# Patient Record
Sex: Female | Born: 1955 | Race: Asian | Hispanic: No | Marital: Married | Smoking: Never smoker
Health system: Southern US, Community
[De-identification: ages and names within clinical notes are randomized; demographics above are authoritative.]

## PROBLEM LIST (undated history)

## (undated) DIAGNOSIS — K859 Acute pancreatitis without necrosis or infection, unspecified: Secondary | ICD-10-CM

## (undated) DIAGNOSIS — M810 Age-related osteoporosis without current pathological fracture: Secondary | ICD-10-CM

## (undated) DIAGNOSIS — E042 Nontoxic multinodular goiter: Secondary | ICD-10-CM

## (undated) DIAGNOSIS — U071 COVID-19: Secondary | ICD-10-CM

## (undated) DIAGNOSIS — I10 Essential (primary) hypertension: Secondary | ICD-10-CM

## (undated) DIAGNOSIS — Q670 Congenital facial asymmetry: Secondary | ICD-10-CM

## (undated) DIAGNOSIS — M7500 Adhesive capsulitis of unspecified shoulder: Secondary | ICD-10-CM

## (undated) DIAGNOSIS — D329 Benign neoplasm of meninges, unspecified: Secondary | ICD-10-CM

## (undated) DIAGNOSIS — C801 Malignant (primary) neoplasm, unspecified: Secondary | ICD-10-CM

## (undated) DIAGNOSIS — C50919 Malignant neoplasm of unspecified site of unspecified female breast: Secondary | ICD-10-CM

## (undated) HISTORY — DX: Age-related osteoporosis without current pathological fracture: M81.0

## (undated) HISTORY — PX: COSMETIC SURGERY: SHX468

## (undated) HISTORY — DX: Adhesive capsulitis of unspecified shoulder: M75.00

## (undated) HISTORY — DX: Essential (primary) hypertension: I10

## (undated) HISTORY — PX: CHOLECYSTECTOMY, LAPAROSCOPIC: SHX56

## (undated) HISTORY — DX: Malignant (primary) neoplasm, unspecified: C80.1

## (undated) HISTORY — PX: BREAST SURGERY: SHX581

## (undated) HISTORY — DX: Malignant neoplasm of unspecified site of unspecified female breast: C50.919

## (undated) HISTORY — DX: Benign neoplasm of meninges, unspecified: D32.9

## (undated) HISTORY — DX: Acute pancreatitis without necrosis or infection, unspecified: K85.90

## (undated) HISTORY — PX: PLACEMENT OF BREAST IMPLANTS: SHX6334

## (undated) HISTORY — DX: COVID-19: U07.1

## (undated) HISTORY — DX: Nontoxic multinodular goiter: E04.2

## (undated) HISTORY — PX: OOPHORECTOMY: SHX86

## (undated) HISTORY — PX: CHOLECYSTECTOMY: SHX55

## (undated) HISTORY — PX: BIOPSY THYROID: PRO38

## (undated) HISTORY — DX: Congenital facial asymmetry: Q67.0

## (undated) HISTORY — PX: BRAIN SURGERY: SHX531

---

## 2009-07-10 DIAGNOSIS — Z853 Personal history of malignant neoplasm of breast: Secondary | ICD-10-CM | POA: Insufficient documentation

## 2009-07-10 DIAGNOSIS — C50919 Malignant neoplasm of unspecified site of unspecified female breast: Secondary | ICD-10-CM

## 2009-07-10 HISTORY — DX: Malignant neoplasm of unspecified site of unspecified female breast: C50.919

## 2009-10-22 DIAGNOSIS — C50919 Malignant neoplasm of unspecified site of unspecified female breast: Secondary | ICD-10-CM | POA: Insufficient documentation

## 2014-10-07 DIAGNOSIS — R229 Localized swelling, mass and lump, unspecified: Secondary | ICD-10-CM | POA: Insufficient documentation

## 2016-11-21 ENCOUNTER — Ambulatory Visit
Admission: RE | Admit: 2016-11-21 | Discharge: 2016-11-21 | Disposition: A | Discharge status: Home / Self Care | Payer: BLUE CROSS/BLUE SHIELD | Source: Ambulatory Visit | Attending: Medical | Admitting: Medical

## 2016-11-21 ENCOUNTER — Ambulatory Visit: Payer: Self-pay | Admitting: Medical

## 2016-11-21 ENCOUNTER — Encounter: Payer: Self-pay | Admitting: Medical

## 2016-11-21 VITALS — BP 140/85 | HR 71 | Temp 99.4°F | Resp 16 | Ht 62.0 in | Wt 131.0 lb

## 2016-11-21 DIAGNOSIS — Z853 Personal history of malignant neoplasm of breast: Secondary | ICD-10-CM

## 2016-11-21 DIAGNOSIS — M50322 Other cervical disc degeneration at C5-C6 level: Secondary | ICD-10-CM | POA: Diagnosis not present

## 2016-11-21 DIAGNOSIS — I10 Essential (primary) hypertension: Secondary | ICD-10-CM

## 2016-11-21 DIAGNOSIS — M62838 Other muscle spasm: Secondary | ICD-10-CM

## 2016-11-21 DIAGNOSIS — M542 Cervicalgia: Secondary | ICD-10-CM

## 2016-11-21 MED ORDER — CYCLOBENZAPRINE HCL 5 MG PO TABS: 5.0000 mg | ORAL_TABLET | Freq: Three times a day (TID) | ORAL | 0 refills | Status: DC | PRN

## 2016-11-21 NOTE — Progress Notes (Signed)
   Subjective:    Patient ID: Tanya Olson, female    DOB: 02-15-1956, 61 y.o.   MRN: [id number]  HPI   61 yo female (spouse of [employer] employee) started with shoulder pain on the left 2 weeks ago,  Which is now better , neck stiffness, dull ache on the left upper shoulder x 1 week. This occurred after her doing some weight training and motion No numbness or tingling. No hand weakness.  Turning of head can feel it but does not limit range of motion or cause pain.  History of Breast Cancer on the left ( lump on the left). 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 Last visit to her oncologist November 2017. ? Bells Palsy in 2014-15.  On the left , more conscious of closing the left eye x  2 days.  Review of Systems  Constitutional: Negative for chills, fever and unexpected weight change.  HENT: Negative.   Eyes: Negative.   Respiratory: Negative.   Cardiovascular: Negative.   Gastrointestinal: Negative.   Genitourinary: Negative.   Musculoskeletal: Positive for neck pain and neck stiffness.  eyelid heaviness on the left. Describes neck as more of discomfort vs pain.    Objective:   Physical Exam  Constitutional: She appears well-developed and well-nourished.  HENT:  Head: Normocephalic and atraumatic.  Right Ear: External ear normal.  Left Ear: External ear normal.  Eyes: Conjunctivae and EOM are normal. Pupils are equal, round, and reactive to light.  Neck: Normal range of motion. Neck supple.  Lymphadenopathy:       Head (right side): No submental, no submandibular and no tonsillar adenopathy present.       Head (left side): No submental, no submandibular and no tonsillar adenopathy present.    She has no cervical adenopathy.       Right cervical: No superficial cervical, no deep cervical and no posterior cervical adenopathy present.      Left cervical: No superficial cervical, no deep cervical and no posterior cervical adenopathy present.       Right: No supraclavicular adenopathy present.       Left: No  supraclavicular adenopathy present.     Equal smile, tongue midline FROM of neck. Non-tender on cervical spine or thoracic spine Equal grip and arm strength 5/5.  Trapezius spasm left more than right. Left eye closing completely, mild swelling noted to lower lid Able to lift brows      Assessment & Plan:  Will call patient tonight with cervical xray results. To return to clinic on Friday E-prescribed cyclobenzaprine 5mg  one tablet by mouth three times a day for muscle spasms. May cause sleepiness. #20 no refills. Continue Advil 400mg  every 6 hours as needed for pain. Recommended massage therapy. . No questions at discharge ,verbalizes understanding.   Reviewed test results with patient:There is mild degenerative disc disease at C4-5 and C5-6. There is facet joint hypertrophy at these levels bilaterally. If the patient's symptoms persist, MRI of the cervical spine would be a useful next imaging step.There is mild degenerative disc disease at C4-5 and C5-6. There is facet joint hypertrophy at these levels bilaterally. If the patient's symptoms persist, MRI of the cervical spine would be a useful next imaging step.

## 2016-11-22 ENCOUNTER — Encounter: Payer: Self-pay | Admitting: Medical

## 2016-11-22 DIAGNOSIS — I10 Essential (primary) hypertension: Secondary | ICD-10-CM | POA: Insufficient documentation

## 2016-11-24 ENCOUNTER — Ambulatory Visit: Payer: Self-pay | Admitting: Medical

## 2016-11-24 VITALS — BP 122/80 | HR 84 | Temp 98.9°F | Resp 16 | Ht 62.0 in | Wt 130.0 lb

## 2016-11-24 DIAGNOSIS — M25512 Pain in left shoulder: Secondary | ICD-10-CM

## 2016-11-24 DIAGNOSIS — M542 Cervicalgia: Secondary | ICD-10-CM

## 2016-11-24 MED ORDER — PREDNISONE 10 MG (21) PO TBPK: ORAL_TABLET | ORAL | 0 refills | Status: DC

## 2016-11-24 NOTE — Progress Notes (Signed)
Patient that muscle relaxer is making her a bit more comfortable, but she's not sure the underlying condition is improving.  Subjective:    Patient ID: Tanya Olson, female    DOB: [birth year], 61 y.o.   MRN: [id number]  HPI 61 yo returns to clinic for follow up, thought was doing better and then woke up in the middle of ngiht with pain. Got up and applied ice t the area with some relief pain located in the left shoulder and tenderness in the posterior shoulder. Still continues to have tenderness in anterior shoulder.  Rates pain 2-3/10.    Review of Systems  Constitutional: Negative for chills and fever.  HENT: Negative.   Eyes: Negative.   Respiratory: Negative.   Cardiovascular: Negative.   Gastrointestinal: Negative.   Genitourinary: Negative.   Musculoskeletal: Positive for neck pain.  Neurological: Negative.        Objective:   Physical Exam  Constitutional: She appears well-developed and well-nourished.  HENT:  Head: Normocephalic and atraumatic.  Eyes: EOM are normal. Pupils are equal, round, and reactive to light.  Neck: Normal range of motion.     Strength hand and  Arms 5/5 bilaterally equal grip , non-tender cervical veterbrae Muscle spasm  on th left side of neck. Though better than before. Mildly Tender on the posterior left shoulder.     Assessment & Plan:  Neck pain radiating to left shoulder. Will schedule  Cervical MRI . Called Dr. Yvetta Coder [phone] who manages the ptatients [place], okay to give steroids for acute pain and inflammation Called patient to let her know approved by Dr. Patrice Paradise for her to take steroids, e-prescribed to patient pharmacy. E-prescribed prednisone taper 10 mg , take  6 tablets by mouth today then 5 tablets tomorrow then one less each day thereafter #21 no refills. Take with food.  Return to the clinic as needed.

## 2016-12-23 ENCOUNTER — Ambulatory Visit
Admission: RE | Admit: 2016-12-23 | Discharge: 2016-12-23 | Disposition: A | Discharge status: Home / Self Care | Payer: BLUE CROSS/BLUE SHIELD | Source: Ambulatory Visit | Attending: Medical | Admitting: Medical

## 2016-12-23 DIAGNOSIS — M542 Cervicalgia: Secondary | ICD-10-CM

## 2016-12-25 ENCOUNTER — Encounter: Payer: Self-pay | Admitting: Medical

## 2016-12-25 ENCOUNTER — Ambulatory Visit: Payer: Self-pay | Admitting: Medical

## 2016-12-25 VITALS — BP 140/78 | HR 100 | Temp 97.9°F | Resp 16 | Ht 62.0 in | Wt 130.0 lb

## 2016-12-25 DIAGNOSIS — R900 Intracranial space-occupying lesion found on diagnostic imaging of central nervous system: Secondary | ICD-10-CM

## 2016-12-25 DIAGNOSIS — M542 Cervicalgia: Secondary | ICD-10-CM

## 2016-12-25 DIAGNOSIS — M62838 Other muscle spasm: Secondary | ICD-10-CM

## 2016-12-25 DIAGNOSIS — E079 Disorder of thyroid, unspecified: Secondary | ICD-10-CM

## 2016-12-25 MED ORDER — CYCLOBENZAPRINE HCL 5 MG PO TABS: 5.0000 mg | ORAL_TABLET | Freq: Three times a day (TID) | ORAL | 0 refills | Status: DC | PRN

## 2016-12-25 NOTE — Progress Notes (Signed)
Subjective:    Patient ID: Tanya Olson, female    DOB: 05/20/1956, 61 y.o.   MRN: [id number]  HPI 61 yo female returns to the clinic to review MRI of cervical spine. Still Having left shoulder and neck pain, rating it a 6/10.  Has notice no weakness of left arm. Feels discomfort when rotating head to the left and when shrugging shoulders.   Review of Systems  Constitutional: Negative for chills and fever.  HENT: Negative for congestion, ear discharge and sore throat.   Eyes: Negative for discharge and itching.  Respiratory: Negative for cough.   Cardiovascular: Negative for chest pain.  Gastrointestinal: Negative for abdominal pain.  Endocrine: Negative for cold intolerance and heat intolerance.  Genitourinary: Negative for hematuria.  Musculoskeletal: Positive for neck pain.  Neurological: Negative for dizziness, syncope and light-headedness.  Hematological: Does not bruise/bleed easily.   Feels like her left eyelid  is heavy, all the time  Frontal headache Memorial Day weekend lasting a few days , gone now.    Objective:   Physical Exam  Constitutional: She is oriented to person, place, and time. Vital signs are normal. She appears well-developed and well-nourished.  HENT:  Head: Normocephalic and atraumatic.  Right Ear: External ear normal.  Left Ear: External ear normal.  Eyes: Conjunctivae and EOM are normal. Pupils are equal, round, and reactive to light.  Neck: Normal range of motion. Neck supple. No thyromegaly present.  Musculoskeletal: Normal range of motion.  Lymphadenopathy:    She has no cervical adenopathy.  Neurological: She is alert and oriented to person, place, and time.  Skin: Skin is warm and dry.  Psychiatric: She has a normal mood and affect. Her behavior is normal. Judgment and thought content normal.  Nursing note and vitals reviewed.  Grip strength  5/5 equal bilaterally,  5/5 on extension and flexion of arms bilaterally No vertebral  tenderness Heel to toe wnl, Negative Rhomberg. Finger to nose within normal limits. Gait within normal limits.  Appearance of left eye lid opening and closing appears normal compared to right lid.       Assessment & Plan:   Ordered lab work  Exec panel(CBCw/diff, Met C, TSH and Thyroid panel , lipid panel and Vitamin D level).  C4-C5 Moderate to advanced disc degeneration, Diffuse uncinate spurring. Mild spinal stenosis and mild foraminal stenosis bilaterally.. C5-6 Disc degeneration and spondylosis. Diffuse uncinate spurring left greater than right . Moderate left foraminal encroachment due to spurring.   C6-C7 Mild disc degenratoin and disc bulging without significant stenois.  Also noted on MRI 2.1 cm intracranial mass anterior to the pons on the right. Possible meningioma Follow up MRI of the brain with and without contrast recommended. Will refer patient to [facility] for consult on  intracranial mass and neck pain.  2.5 cm left thyroid mass which could be benign or malignant. Follow-up thryroid ultrasound and possible biopsy. Patient states she has had  thyroid cyst before that required biopsies.  She is going to coordinate appointment with Dr. Yvetta Olson her endocrinologist in [place]. (patient sees MD on Wednesday). Results and notes will be sent to provider.  [name]  Dr. Yvetta Olson, [place], [address] [place] [address], [phone].  I have been seeing her for my osteoporosis.  Dr. Caralyn Olson, [place], [address] [place], [place] [address], [phone], is who I have seen for the thryoid scans/biopses.  I am going to try to consolidate my records with Dr. Patrice Olson, so that I can see just one doctor for both issues.  Patent traveling to [place] a lot this summer, seeing local Neurosusrgery group for second opinion. She will be seeing a neurosurgeon that her  husband had used for back surgery at [facility]

## 2016-12-27 ENCOUNTER — Other Ambulatory Visit: Payer: Self-pay

## 2016-12-27 DIAGNOSIS — Z0000 Encounter for general adult medical examination without abnormal findings: Secondary | ICD-10-CM

## 2016-12-28 LAB — CMP12+LP+TP+TSH+6AC+CBC/D/PLT
ALK PHOS: 83 IU/L (ref 39–117)
ALT: 29 IU/L (ref 0–32)
AST: 25 IU/L (ref 0–40)
Albumin/Globulin Ratio: 1.8 (ref 1.2–2.2)
Albumin: 4.2 g/dL (ref 3.6–4.8)
BUN / CREAT RATIO: 16 (ref 12–28)
BUN: 15 mg/dL (ref 8–27)
Basophils Absolute: 0 10*3/uL (ref 0.0–0.2)
Basos: 1 %
Bilirubin Total: 0.3 mg/dL (ref 0.0–1.2)
CALCIUM: 9.4 mg/dL (ref 8.7–10.3)
CHOL/HDL RATIO: 3.5 ratio (ref 0.0–4.4)
Chloride: 105 mmol/L (ref 96–106)
Cholesterol, Total: 198 mg/dL (ref 100–199)
Creatinine, Ser: 0.93 mg/dL (ref 0.57–1.00)
EOS (ABSOLUTE): 0.2 10*3/uL (ref 0.0–0.4)
Eos: 3 %
Estimated CHD Risk: 0.6 times avg. (ref 0.0–1.0)
Free Thyroxine Index: 1.8 (ref 1.2–4.9)
GFR calc non Af Amer: 67 mL/min/{1.73_m2} (ref >59)
GFR, EST AFRICAN AMERICAN: 77 mL/min/{1.73_m2} (ref >59)
GGT: 20 IU/L (ref 0–60)
GLOBULIN, TOTAL: 2.4 g/dL (ref 1.5–4.5)
Glucose: 95 mg/dL (ref 65–99)
HDL: 57 mg/dL (ref >39)
HEMATOCRIT: 41.9 % (ref 34.0–46.6)
Hemoglobin: 13.5 g/dL (ref 11.1–15.9)
IMMATURE GRANS (ABS): 0 10*3/uL (ref 0.0–0.1)
Immature Granulocytes: 0 %
Iron: 72 ug/dL (ref 27–139)
LDH: 160 IU/L (ref 119–226)
LDL CALC: 117 mg/dL — AB (ref 0–99)
LYMPHS: 34 %
Lymphocytes Absolute: 1.9 10*3/uL (ref 0.7–3.1)
MCH: 29.9 pg (ref 26.6–33.0)
MCHC: 32.2 g/dL (ref 31.5–35.7)
MCV: 93 fL (ref 79–97)
MONOCYTES: 6 %
Monocytes Absolute: 0.4 10*3/uL (ref 0.1–0.9)
NEUTROS ABS: 3.1 10*3/uL (ref 1.4–7.0)
Neutrophils: 56 %
POTASSIUM: 4.2 mmol/L (ref 3.5–5.2)
Phosphorus: 4.4 mg/dL (ref 2.5–4.5)
Platelets: 300 10*3/uL (ref 150–379)
RBC: 4.52 x10E6/uL (ref 3.77–5.28)
RDW: 13.7 % (ref 12.3–15.4)
Sodium: 144 mmol/L (ref 134–144)
T3 Uptake Ratio: 22 % — ABNORMAL LOW (ref 24–39)
T4, Total: 8.2 ug/dL (ref 4.5–12.0)
TRIGLYCERIDES: 120 mg/dL (ref 0–149)
TSH: 0.755 u[IU]/mL (ref 0.450–4.500)
Total Protein: 6.6 g/dL (ref 6.0–8.5)
URIC ACID: 5 mg/dL (ref 2.5–7.1)
VLDL Cholesterol Cal: 24 mg/dL (ref 5–40)
WBC: 5.6 10*3/uL (ref 3.4–10.8)

## 2016-12-28 LAB — VITAMIN D 25 HYDROXY (VIT D DEFICIENCY, FRACTURES): Vit D, 25-Hydroxy: 31.8 ng/mL (ref 30.0–100.0)

## 2017-01-15 ENCOUNTER — Encounter: Payer: Self-pay | Admitting: Medical

## 2017-01-17 ENCOUNTER — Other Ambulatory Visit: Payer: Self-pay | Admitting: *Deleted

## 2017-01-17 DIAGNOSIS — M810 Age-related osteoporosis without current pathological fracture: Secondary | ICD-10-CM

## 2017-01-18 ENCOUNTER — Other Ambulatory Visit: Payer: Self-pay | Admitting: Endocrinology

## 2017-01-18 DIAGNOSIS — M810 Age-related osteoporosis without current pathological fracture: Secondary | ICD-10-CM

## 2017-01-22 ENCOUNTER — Ambulatory Visit
Admission: RE | Admit: 2017-01-22 | Discharge: 2017-01-22 | Disposition: A | Discharge status: Home / Self Care | Payer: BLUE CROSS/BLUE SHIELD | Source: Ambulatory Visit | Attending: Endocrinology | Admitting: Endocrinology

## 2017-01-22 DIAGNOSIS — M810 Age-related osteoporosis without current pathological fracture: Secondary | ICD-10-CM | POA: Insufficient documentation

## 2017-01-30 DIAGNOSIS — G9389 Other specified disorders of brain: Secondary | ICD-10-CM | POA: Insufficient documentation

## 2017-02-07 DIAGNOSIS — R51 Headache: Secondary | ICD-10-CM | POA: Diagnosis not present

## 2017-02-07 DIAGNOSIS — I10 Essential (primary) hypertension: Secondary | ICD-10-CM | POA: Diagnosis not present

## 2017-02-07 DIAGNOSIS — Z853 Personal history of malignant neoplasm of breast: Secondary | ICD-10-CM | POA: Diagnosis not present

## 2017-02-07 DIAGNOSIS — C50919 Malignant neoplasm of unspecified site of unspecified female breast: Secondary | ICD-10-CM | POA: Diagnosis not present

## 2017-02-07 DIAGNOSIS — E042 Nontoxic multinodular goiter: Secondary | ICD-10-CM | POA: Diagnosis not present

## 2017-02-07 DIAGNOSIS — G510 Bell's palsy: Secondary | ICD-10-CM | POA: Diagnosis not present

## 2017-02-07 DIAGNOSIS — M25519 Pain in unspecified shoulder: Secondary | ICD-10-CM | POA: Diagnosis not present

## 2017-02-07 DIAGNOSIS — Z7189 Other specified counseling: Secondary | ICD-10-CM | POA: Diagnosis not present

## 2017-02-07 DIAGNOSIS — R29810 Facial weakness: Secondary | ICD-10-CM | POA: Diagnosis not present

## 2017-02-07 DIAGNOSIS — G9389 Other specified disorders of brain: Secondary | ICD-10-CM | POA: Diagnosis not present

## 2017-02-07 DIAGNOSIS — D329 Benign neoplasm of meninges, unspecified: Secondary | ICD-10-CM | POA: Diagnosis not present

## 2017-02-07 DIAGNOSIS — M542 Cervicalgia: Secondary | ICD-10-CM | POA: Diagnosis not present

## 2017-02-07 DIAGNOSIS — J324 Chronic pansinusitis: Secondary | ICD-10-CM | POA: Diagnosis not present

## 2017-02-15 DIAGNOSIS — G9389 Other specified disorders of brain: Secondary | ICD-10-CM | POA: Diagnosis not present

## 2017-02-15 DIAGNOSIS — I10 Essential (primary) hypertension: Secondary | ICD-10-CM | POA: Diagnosis not present

## 2017-02-15 DIAGNOSIS — D34 Benign neoplasm of thyroid gland: Secondary | ICD-10-CM | POA: Diagnosis not present

## 2017-02-15 DIAGNOSIS — E049 Nontoxic goiter, unspecified: Secondary | ICD-10-CM | POA: Diagnosis not present

## 2017-02-15 DIAGNOSIS — R29810 Facial weakness: Secondary | ICD-10-CM | POA: Diagnosis not present

## 2017-02-15 DIAGNOSIS — E041 Nontoxic single thyroid nodule: Secondary | ICD-10-CM | POA: Diagnosis not present

## 2017-02-15 DIAGNOSIS — M542 Cervicalgia: Secondary | ICD-10-CM | POA: Diagnosis not present

## 2017-02-15 DIAGNOSIS — E042 Nontoxic multinodular goiter: Secondary | ICD-10-CM | POA: Diagnosis not present

## 2017-02-15 DIAGNOSIS — D329 Benign neoplasm of meninges, unspecified: Secondary | ICD-10-CM | POA: Diagnosis not present

## 2017-02-15 DIAGNOSIS — Z853 Personal history of malignant neoplasm of breast: Secondary | ICD-10-CM | POA: Diagnosis not present

## 2017-02-19 DIAGNOSIS — D34 Benign neoplasm of thyroid gland: Secondary | ICD-10-CM | POA: Diagnosis not present

## 2017-03-05 ENCOUNTER — Encounter: Payer: Self-pay | Admitting: Medical

## 2017-03-07 ENCOUNTER — Telehealth: Payer: Self-pay | Admitting: Medical

## 2017-03-07 DIAGNOSIS — M5412 Radiculopathy, cervical region: Secondary | ICD-10-CM

## 2017-03-07 DIAGNOSIS — M542 Cervicalgia: Secondary | ICD-10-CM

## 2017-03-07 NOTE — Telephone Encounter (Signed)
Patient with order for   Physical Therapy   Dx neck pain  C-4-C5 radiculapathy eval and trear for neck and radicular pain into shoulder ICD10 M54.12 and M54.2  Referral made for PT Due to original order out of state [place].  Scanned order into chart.

## 2017-03-11 NOTE — Telephone Encounter (Signed)
  Patient needing prescription from [facility] provider for Physical Therapy per her doctor [place] [facility] [place]. See media prescription.

## 2017-03-20 ENCOUNTER — Ambulatory Visit: Payer: BLUE CROSS/BLUE SHIELD | Attending: Medical

## 2017-03-20 DIAGNOSIS — M25512 Pain in left shoulder: Secondary | ICD-10-CM | POA: Insufficient documentation

## 2017-03-20 DIAGNOSIS — G8929 Other chronic pain: Secondary | ICD-10-CM

## 2017-03-20 DIAGNOSIS — M542 Cervicalgia: Secondary | ICD-10-CM | POA: Diagnosis not present

## 2017-03-20 NOTE — Patient Instructions (Addendum)
    Scapular Retraction (Standing)   With arms at sides, pinch shoulder blades together. Hold for 5 seconds. Repeat __10__ times per set. Do __3__ sets per session.  Also try to maintain this position as best as you can throughout the day.      Copyright  VHI. All rights reserved.     Also gave L shoulder towel IR stretch 10x3 daily and sitting with a lumbar towel roll to promote proper posture as part of her HEP. Pt demonstrated and verbalized understanding.

## 2017-03-20 NOTE — Therapy (Signed)
[facility] [address] [place], [address] Phone: [phone]   Fax:  [phone]  Physical Therapy Evaluation  Patient Details  Name: Tanya Olson MRN: [id number] Date of Birth: 05-27-1956 Referring Provider: Daryll Drown, PA-C  Encounter Date: 03/20/2017      PT End of Session - 03/20/17 1035    Visit Number 1   Number of Visits 13   Date for PT Re-Evaluation 05/03/17   PT Start Time 7253   PT Stop Time 1142   PT Time Calculation (min) 65 min   Activity Tolerance Patient tolerated treatment well   Behavior During Therapy [facility] for tasks assessed/performed      Past Medical History:  Diagnosis Date  . Cancer ([place])   . Hypertension     No past surgical history on file.  There were no vitals filed for this visit.       Subjective Assessment - 03/20/17 1043    Subjective Neck and shoulder: 4/10 currently, 1-2/10 at best for the past 2 months (when pt tries not to use it), 6/10 at most for the past 2 months.   Pt states blood pressure is controlled   Pertinent History Neck and posterior shoulder pain with L sided weakness. Difficulty raising her L arm. Sudden onset around mid May 2018. Got a cervical MRI which revealed bone spurs around C5. One doctor recomended surgery, another doctor recommended PT. Pt wants to try PT first. Denies tingling or numbness.  Pt is R hand dominant. Has not had PT for her neck and L shoulder before. Denies unexplained changes in weight.  Pain hurts more at night than in the morning but pt can sleep. Pt sleeps on her R side currently. Can't sleep on her L side due to pain or feels like she is going to hurt it.  Pt adds seeing an oncologist regularly. Last appointment was on January 26, 2017. Sees her every 6 months. Pt also adds that a mengioma (benign) and thyroid nodules were seen with the MRI which are also benign and no spread of the CA. No UE paresthesias.    Patient  Stated Goals I would love to be able to have perfect ROM, no pain, be back to how she was before May 2018 (no problems using her L UE), not have surgery.    Currently in Pain? Yes   Pain Score 4    Pain Location Shoulder   Pain Orientation Left   Pain Descriptors / Indicators Aching   Pain Type Chronic pain   Pain Radiating Towards neck to L shoulder/upper trap area   Pain Onset More than a month ago   Pain Frequency Occasional   Aggravating Factors  Moving her L arm, swimming, donning and doffing a shirt of sweater, raising her L hand.    Pain Relieving Factors Heat to L upper trap,  advil, resting,             [facility] PT Assessment - 03/20/17 1052      Assessment   Medical Diagnosis Cervicalgia, cervical radiculitis   Referring Provider Daryll Drown, PA-C   Onset Date/Surgical Date 11/22/16  Mid May 2018, no specific date provided   Hand Dominance Right   Prior Therapy none for current condition     Precautions   Precaution Comments Hx of breast CA 2011, S/P bilateral mastectomy     Restrictions   Other Position/Activity Restrictions No known weight bearing restrictions  Balance Screen   Has the patient fallen in the past 6 months No   Has the patient had a decrease in activity level because of a fear of falling?  No   Is the patient reluctant to leave their home because of a fear of falling?  No     Home Environment   Additional Comments Patient lives in a 2 story home with her husband. Bilateral rail to get to second floor, no number of steps provided.      Prior Function   Vocation Full time employment  lawyer   Vocation Requirements PLOF: no problems raising her L arm or donning and doffing shirts and sweaters.      Observation/Other Assessments   Neck Disability Index  22%   Quick DASH  34%     Posture/Postural Control   Posture Comments bilaterally protracted shoulders and neck, L shoulder highter, R cervical side bend, movement preference around  C6/C7     AROM   Overall AROM Comments L shoulder shrug and back compensation with L shoulder flexion and abduction AROM   Left Shoulder Flexion 85 Degrees  92 deg AAROM with stiff end feel. Reproducted L UT pain   Left Shoulder ABduction 72 Degrees  87 AAROM with stiff end feel and reproduction of UT pain   Cervical Flexion 28   Cervical Extension 56   Cervical - Right Side Bend 35   Cervical - Left Side Bend 40   Cervical - Right Rotation 58  with posterior neck discomfort (different pain)   Cervical - Left Rotation 48  with pain in L base of neck and upper trap area     Strength   Overall Strength Comments L shoulder manual muscle strength test performed within available ROM.    Right Shoulder Flexion 4+/5   Right Shoulder ABduction 4+/5   Left Shoulder Flexion 4+/5   Left Shoulder ABduction 4/5   Right Elbow Flexion 5/5   Right Elbow Extension 5/5   Left Elbow Flexion 4+/5   Left Elbow Extension 4+/5   Right Wrist Extension 4+/5   Left Wrist Extension 4+/5     Palpation   Palpation comment L upper trap muscle tension, no TTP            Objective measurements completed on examination: See above findings.  Pt adds seeing an oncologist regularly. Last appointment was on January 26, 2017. Sees her every 6 months. Pt also adds that a mengioma (benign) and thyroid nodules were seen with the MRI which are also benign and no spread of the CA. No UE paresthesias  Manual therapy   STM L upper trap muscle    Ther-ex   Seated L scapular retraction manually resisted, targeting the lower trap muscle 10x3 with 5 second holds. Decreased neck/upper trap area pain.   Sitting with lumbar towel roll to promote upright posture.   L shoulder towel IR stretch 10x2  Decreased neck/shoulder pain to 2/10 at rest after aforementioned exercises.      Improved exercise technique, movement at target joints, use of target muscles after mod verbal, visual, tactile cues.                  PT Education - 03/20/17 1156    Education provided Yes   Education Details ther-ex, plan of care, HEP   Person(s) Educated Patient   Methods Explanation;Demonstration;Tactile cues;Verbal cues;Handout   Comprehension Returned demonstration;Verbalized understanding  PT Long Term Goals - 03/20/17 1158      PT LONG TERM GOAL #1   Title Patient will have a decrease in L cervical/shoulder pain to 2/10 or less at worst to promote ability to raise her L arm, as well as don and doff shirts and sweaters.    Baseline 6/10 L cervical and shoulder pain at worst (03/20/2017)   Time 6   Period Weeks   Status New   Target Date 05/03/17     PT LONG TERM GOAL #2   Title Patient will improve L shoulder flexion and abduction AROM to at least 110 degrees or more to promote to promote ability to raise her L arm and perform functional tasks.    Baseline L shoulder AROM: 85 degrees flexion, 72 degrees abduction (03/20/2017)   Time 6   Period Weeks   Status New   Target Date 05/03/17     PT LONG TERM GOAL #3   Title Patient will improve her Quick Dash score by at least 15% as a demonstration of improved function.    Baseline 34% (03/20/2017)   Time 6   Period Weeks   Status New   Target Date 05/03/17     PT LONG TERM GOAL #4   Title Patient will improve her Neck Disability Index Score by at least 12% as a demonstration of impoved function.    Baseline 22% (03/20/2017)   Time 6   Period Weeks   Status New   Target Date 05/03/17                Plan - 03/20/17 1150    Clinical Impression Statement Pt is a 61 year old female who came to physical therapy secondary to L cervical and shoulder (upper trap) pain. She also presents with limited L shoulder flexion and abduction AROM with stiffness, L upper trap muscle tension, limited cervical ROM, and difficulty performing functional tasks such as raising her L arm, as well as donning and doffing clothes. Patient will  benefit from skilled physical therapy services to address the aforementioned deficits.     History and Personal Factors relevant to plan of care: Chronicity of condition, difficulty raising her L arm, donning and doffing shirts and sweaters   Clinical Presentation Stable   Clinical Presentation due to: Pain has not worsened since onset   Clinical Decision Making Low   Rehab Potential Good   Clinical Impairments Affecting Rehab Potential Chronicity of condition, hx of CA   PT Frequency 2x / week   PT Duration 6 weeks   PT Treatment/Interventions Therapeutic exercise;Therapeutic activities;Manual techniques;Patient/family education;Neuromuscular re-education;Ultrasound   PT Next Visit Plan scapular strengthening, posterior L shoulder capsule stretch, L shoulder ROM, rotator cuff strengthening   Consulted and Agree with Plan of Care Patient      Patient will benefit from skilled therapeutic intervention in order to improve the following deficits and impairments:  Pain, Postural dysfunction, Improper body mechanics, Decreased strength, Decreased range of motion  Visit Diagnosis: Chronic left shoulder pain - Plan: PT plan of care cert/re-cert  Cervicalgia - Plan: PT plan of care cert/re-cert     Problem List Patient Active Problem List   Diagnosis Date Noted  . Hypertension 11/22/2016  . History of bilateral breast cancer 07/10/2009   Joneen Boers PT, DPT   03/20/2017, 12:39 PM  [facility] [address], [place], [address] Phone: [phone]   Fax:  [phone]  Name: Tanya Olson MRN: [id number] Date of Birth: 1955-10-13

## 2017-03-26 ENCOUNTER — Ambulatory Visit: Payer: BLUE CROSS/BLUE SHIELD

## 2017-03-26 DIAGNOSIS — M25512 Pain in left shoulder: Secondary | ICD-10-CM | POA: Diagnosis not present

## 2017-03-26 DIAGNOSIS — M542 Cervicalgia: Secondary | ICD-10-CM

## 2017-03-26 DIAGNOSIS — G8929 Other chronic pain: Secondary | ICD-10-CM

## 2017-03-26 NOTE — Therapy (Signed)
[facility] [address], [place], [address] Phone: [phone]   Fax:  [phone]  Physical Therapy Treatment  Patient Details  Name: Tanya Olson MRN: [id number] Date of Birth: 06-Oct-1955 Referring Provider: Daryll Drown, PA-C  Encounter Date: 03/26/2017      PT End of Session - 03/26/17 0904    Visit Number 2   Number of Visits 13   Date for PT Re-Evaluation 05/03/17   PT Start Time 0904   PT Stop Time 0950   PT Time Calculation (min) 46 min   Activity Tolerance Patient tolerated treatment well   Behavior During Therapy [facility] for tasks assessed/performed      Past Medical History:  Diagnosis Date  . Cancer ([place])   . Hypertension     No past surgical history on file.  There were no vitals filed for this visit.      Subjective Assessment - 03/26/17 0905    Subjective Neck and shoulder has hurt a lot more this past week. Does not know if it was from her traveling, the moisture in the air or the exercises.  6/10 currently.  Went to [place] last week.  Neck and L shoulder felt great after last session. Pt also works in front of her laptop a lot.    Pertinent History Neck and posterior shoulder pain with L sided weakness. Difficulty raising her L arm. Sudden onset around mid May 2018. Got a cervical MRI which revealed bone spurs around C5. One doctor recomended surgery, another doctor recommended PT. Pt wants to try PT first. Denies tingling or numbness.  Pt is R hand dominant. Has not had PT for her neck and L shoulder before. Denies unexplained changes in weight.  Pain hurts more at night than in the morning but pt can sleep. Pt sleeps on her R side currently. Can't sleep on her L side due to pain or feels like she is going to hurt it.  Pt adds seeing an oncologist regularly. Last appointment was on January 26, 2017. Sees her every 6 months. Pt also adds that a mengioma (benign) and thyroid nodules were  seen with the MRI which are also benign and no spread of the CA. No UE paresthesias.    Patient Stated Goals I would love to be able to have perfect ROM, no pain, be back to how she was before May 2018 (no problems using her L UE), not have surgery.    Currently in Pain? Yes   Pain Score 6    Pain Onset More than a month ago            [facility] PT Assessment - 03/26/17 0814      Strength   Right Shoulder Internal Rotation 5/5   Right Shoulder External Rotation 4/5   Left Shoulder Internal Rotation 4+/5   Left Shoulder External Rotation 4/5                             PT Education - 03/26/17 0916    Education provided Yes   Education Details ther-ex, ergomimic laptop   Person(s) Educated Patient   Methods Explanation;Demonstration;Tactile cues;Verbal cues   Comprehension Returned demonstration;Verbalized understanding       Objectives   Ther-ex   Pt was recommended to sit closer to laptop, elevate the laptop so that it is eye level and to tilt the laptop towards her for more  comfort. Pt demonstrated and verbalized understanding. Pt states it felt more comfortable.   L shoulder towel IR stretch 10x3   Seated L scapular retraction manually resisted, targeting the lower trap muscle 10x3 with 5 second holds.   Seated press-ups 10x3  Standing manually resisted L scapular depression isometrics 10x3 with 5 second holds   Pt states neck feeling a little more loose after aforementioned exercises  Standing L shoulder ER resisting yellow band 10x  Standing L shoulder IR resisting yellow band 10x3   Standing bilateral scapular retraction resisting yellow band 10x3 with 5 second holds.   L S/L posterior capsule stretch 3x5 with 5 second holds secondary to shoulder joint stiffness.     Improved exercise technique, movement at target joints, use of target muscles after mod verbal, visual, tactile cues.    5/10 L shoulder discomfort after session. Pt  states neck feels more loose. Continue with joint capsule stretch, scapular strengthening to improve joint mobility in L shoulder and decrease L upper trap muscle tension.             PT Long Term Goals - 03/20/17 1158      PT LONG TERM GOAL #1   Title Patient will have a decrease in L cervical/shoulder pain to 2/10 or less at worst to promote ability to raise her L arm, as well as don and doff shirts and sweaters.    Baseline 6/10 L cervical and shoulder pain at worst (03/20/2017)   Time 6   Period Weeks   Status New   Target Date 05/03/17     PT LONG TERM GOAL #2   Title Patient will improve L shoulder flexion and abduction AROM to at least 110 degrees or more to promote to promote ability to raise her L arm and perform functional tasks.    Baseline L shoulder AROM: 85 degrees flexion, 72 degrees abduction (03/20/2017)   Time 6   Period Weeks   Status New   Target Date 05/03/17     PT LONG TERM GOAL #3   Title Patient will improve her Quick Dash score by at least 15% as a demonstration of improved function.    Baseline 34% (03/20/2017)   Time 6   Period Weeks   Status New   Target Date 05/03/17     PT LONG TERM GOAL #4   Title Patient will improve her Neck Disability Index Score by at least 12% as a demonstration of impoved function.    Baseline 22% (03/20/2017)   Time 6   Period Weeks   Status New   Target Date 05/03/17               Plan - 03/26/17 0941    Clinical Impression Statement 5/10 L shoulder discomfort after session. Pt states neck feels more loose. Continue with joint capsule stretch, scapular strengthening to improve joint mobility in L shoulder and decrease L upper trap muscle tension.    History and Personal Factors relevant to plan of care: Chronicity of condition, difficulty raising her L arm, donning and doffing shirts and sweaters   Clinical Presentation Stable   Clinical Presentation due to: Decreased discomfort after session   Clinical  Decision Making Low   Rehab Potential Good   Clinical Impairments Affecting Rehab Potential Chronicity of condition, hx of CA   PT Frequency 2x / week   PT Duration 6 weeks   PT Treatment/Interventions Therapeutic exercise;Therapeutic activities;Manual techniques;Patient/family education;Neuromuscular re-education;Ultrasound   PT Next Visit Plan  scapular strengthening, posterior L shoulder capsule stretch, L shoulder ROM, rotator cuff strengthening   Consulted and Agree with Plan of Care Patient      Patient will benefit from skilled therapeutic intervention in order to improve the following deficits and impairments:  Pain, Postural dysfunction, Improper body mechanics, Decreased strength, Decreased range of motion  Visit Diagnosis: Cervicalgia  Chronic left shoulder pain     Problem List Patient Active Problem List   Diagnosis Date Noted  . Hypertension 11/22/2016  . History of bilateral breast cancer 07/10/2009   Joneen Boers PT, DPT   03/26/2017, 11:12 AM  [facility] [address], [place], [address] Phone: [phone]   Fax:  [phone]  Name: Tanya Olson MRN: [id number] Date of Birth: [birth year]

## 2017-03-28 ENCOUNTER — Ambulatory Visit: Payer: BLUE CROSS/BLUE SHIELD

## 2017-03-28 DIAGNOSIS — M25512 Pain in left shoulder: Secondary | ICD-10-CM | POA: Diagnosis not present

## 2017-03-28 DIAGNOSIS — G8929 Other chronic pain: Secondary | ICD-10-CM | POA: Diagnosis not present

## 2017-03-28 DIAGNOSIS — M542 Cervicalgia: Secondary | ICD-10-CM

## 2017-03-28 NOTE — Patient Instructions (Signed)
Posterior Capsule Sleeper Stretch, Side-Lying    Lie on side, pillow under head, neck in neutral, underside arm in 90-90 of shoulder and elbow flexion with scapula fixed to table. Use other hand to press affected wrist forward and downward. Keep elbow angle. Hold _5__ seconds.  Repeat _5_- 10_ times per session. Do __3_ sessions per day.   Copyright  VHI. All rights reserved.

## 2017-03-28 NOTE — Therapy (Signed)
[facility] [address], [place], [address] Phone: [phone]   Fax:  [phone]  Physical Therapy Treatment  Patient Details  Name: Tanya Olson MRN: [id number] Date of Birth: [birth year] Referring Provider: Daryll Drown, PA-C  Encounter Date: 03/28/2017      PT End of Session - 03/28/17 1033    Visit Number 3   Number of Visits 13   Date for PT Re-Evaluation 05/03/17   PT Start Time 1034   PT Stop Time 1114   PT Time Calculation (min) 40 min   Activity Tolerance Patient tolerated treatment well   Behavior During Therapy [facility] for tasks assessed/performed      Past Medical History:  Diagnosis Date  . Cancer ([place])   . Hypertension     No past surgical history on file.  There were no vitals filed for this visit.      Subjective Assessment - 03/28/17 1035    Subjective Neck is about a 5/10 currently. Was really good until last night right about when she was about to sleep when it started hurting again. Pt does not know what increased tightness again.  Felt pretty good after last session which lasted until last night. Did the exercise with the towel (IR stretch) but it did not really alleviate it.    Pertinent History Neck and posterior shoulder pain with L sided weakness. Difficulty raising her L arm. Sudden onset around mid May 2018. Got a cervical MRI which revealed bone spurs around C5. One doctor recomended surgery, another doctor recommended PT. Pt wants to try PT first. Denies tingling or numbness.  Pt is R hand dominant. Has not had PT for her neck and L shoulder before. Denies unexplained changes in weight.  Pain hurts more at night than in the morning but pt can sleep. Pt sleeps on her R side currently. Can't sleep on her L side due to pain or feels like she is going to hurt it.  Pt adds seeing an oncologist regularly. Last appointment was on January 26, 2017. Sees her every 6 months. Pt also adds  that a mengioma (benign) and thyroid nodules were seen with the MRI which are also benign and no spread of the CA. No UE paresthesias.    Patient Stated Goals I would love to be able to have perfect ROM, no pain, be back to how she was before May 2018 (no problems using her L UE), not have surgery.    Currently in Pain? Yes   Pain Score 5    Pain Onset More than a month ago                                 PT Education - 03/28/17 1043    Education provided Yes   Education Details ther-ex, HEP   Person(s) Educated Patient   Methods Explanation;Demonstration;Tactile cues;Verbal cues;Handout   Comprehension Returned demonstration;Verbalized understanding        Objectives   Ther-ex   Cervical rotation. Increased symptoms L > R rotation  Supine with head resting on deflated ball.    Chin tucks with tongue resting at roof of mouth 10x2 with 5 second holds   Then with cervical rotation 10x3 each side   Then with cervical nodding 1 min x 3     Then with bilateral scapular retraction 10x3 with 5 second holds  L S/L posterior capsule  stretch 3x5 with 5 second holds secondary to shoulder joint stiffness. Decreased L shoulder/neck pain.   Reviewed and given as part of her HEP. Pt demonstrated and verbalized understanding   Standing push-up position at table:  Scapular protraction/retraction 10x2. L shoulder and neck feels more loose per pt  Standing L shoulder IR resisting yellow band 10x3. No change in symptoms.    Improved exercise technique, movement at target joints, use of target muscles after mod verbal, visual, tactile cues.    Pt demonstrates tendency for R cervical side bending with exercises. Cues and assist needed to maintain neutral position. Decreased symptoms with exercises to promote L shoulder capsule mobility.             PT Long Term Goals - 03/20/17 1158      PT LONG TERM GOAL #1   Title Patient will have a decrease  in L cervical/shoulder pain to 2/10 or less at worst to promote ability to raise her L arm, as well as don and doff shirts and sweaters.    Baseline 6/10 L cervical and shoulder pain at worst (03/20/2017)   Time 6   Period Weeks   Status New   Target Date 05/03/17     PT LONG TERM GOAL #2   Title Patient will improve L shoulder flexion and abduction AROM to at least 110 degrees or more to promote to promote ability to raise her L arm and perform functional tasks.    Baseline L shoulder AROM: 85 degrees flexion, 72 degrees abduction (03/20/2017)   Time 6   Period Weeks   Status New   Target Date 05/03/17     PT LONG TERM GOAL #3   Title Patient will improve her Quick Dash score by at least 15% as a demonstration of improved function.    Baseline 34% (03/20/2017)   Time 6   Period Weeks   Status New   Target Date 05/03/17     PT LONG TERM GOAL #4   Title Patient will improve her Neck Disability Index Score by at least 12% as a demonstration of impoved function.    Baseline 22% (03/20/2017)   Time 6   Period Weeks   Status New   Target Date 05/03/17               Plan - 03/28/17 1033    Clinical Impression Statement Pt demonstrates tendency for R cervical side bending with exercises. Cues and assist needed to maintain neutral position. Decreased symptoms with exercises to promote L shoulder capsule mobility.   History and Personal Factors relevant to plan of care: Chronicity of condition, difficulty raising her L arm, donning and doffing shirts and sweaters   Clinical Presentation Stable   Clinical Decision Making Low   Rehab Potential Good   Clinical Impairments Affecting Rehab Potential Chronicity of condition, hx of CA   PT Frequency 2x / week   PT Duration 6 weeks   PT Treatment/Interventions Therapeutic exercise;Therapeutic activities;Manual techniques;Patient/family education;Neuromuscular re-education;Ultrasound   PT Next Visit Plan scapular strengthening, posterior  L shoulder capsule stretch, L shoulder ROM, rotator cuff strengthening   Consulted and Agree with Plan of Care Patient      Patient will benefit from skilled therapeutic intervention in order to improve the following deficits and impairments:  Pain, Postural dysfunction, Improper body mechanics, Decreased strength, Decreased range of motion  Visit Diagnosis: Cervicalgia  Chronic left shoulder pain     Problem List Patient Active Problem List  Diagnosis Date Noted  . Hypertension 11/22/2016  . History of bilateral breast cancer 07/10/2009    Joneen Boers PT, DPT   03/28/2017, 9:52 PM  [facility] [address] [place], [address] Phone: [phone]   Fax:  [phone]  Name: Tanya Olson MRN: [id number] Date of Birth: 07/12/1955

## 2017-04-04 ENCOUNTER — Ambulatory Visit: Payer: BLUE CROSS/BLUE SHIELD

## 2017-04-04 DIAGNOSIS — M25512 Pain in left shoulder: Principal | ICD-10-CM

## 2017-04-04 DIAGNOSIS — G8929 Other chronic pain: Secondary | ICD-10-CM

## 2017-04-04 DIAGNOSIS — M542 Cervicalgia: Secondary | ICD-10-CM

## 2017-04-04 NOTE — Therapy (Signed)
[facility] [address] [place], [address] Phone: [phone]   Fax:  [phone]  Physical Therapy Treatment  Patient Details  Name: Tanya Olson MRN: [id number] Date of Birth: 20-Aug-1955 Referring Provider: Daryll Drown, PA-C  Encounter Date: 04/04/2017      PT End of Session - 04/04/17 1043    Visit Number 4   Number of Visits 13   Date for PT Re-Evaluation 05/03/17   PT Start Time 7782   PT Stop Time 1123   PT Time Calculation (min) 40 min   Activity Tolerance Patient tolerated treatment well   Behavior During Therapy [facility] for tasks assessed/performed      Past Medical History:  Diagnosis Date  . Breast cancer ([place]) 2011   Bilateral. S/P bilateral mastectomy with reconstruction  . Cancer ([place])   . Hypertension     Past Surgical History:  Procedure Laterality Date  . CHOLECYSTECTOMY, LAPAROSCOPIC      There were no vitals filed for this visit.      Subjective Assessment - 04/04/17 1043    Subjective Neck and shoulder are not too bad. Not really having pain currently. Did take some advil. Pt states having a Hx of bilateral breast CA 2011   Pertinent History Neck and posterior shoulder pain with L sided weakness. Difficulty raising her L arm. Sudden onset around mid May 2018. Got a cervical MRI which revealed bone spurs around C5. One doctor recomended surgery, another doctor recommended PT. Pt wants to try PT first. Denies tingling or numbness.  Pt is R hand dominant. Has not had PT for her neck and L shoulder before. Denies unexplained changes in weight.  Pain hurts more at night than in the morning but pt can sleep. Pt sleeps on her R side currently. Can't sleep on her L side due to pain or feels like she is going to hurt it.  Pt adds seeing an oncologist regularly. Last appointment was on January 26, 2017. Sees her every 6 months. Pt also adds that a mengioma (benign) and thyroid nodules were  seen with the MRI which are also benign and no spread of the CA. No UE paresthesias.    Patient Stated Goals I would love to be able to have perfect ROM, no pain, be back to how she was before May 2018 (no problems using her L UE), not have surgery.    Currently in Pain? No/denies   Pain Onset More than a month ago                                 PT Education - 04/04/17 1046    Education provided Yes   Education Details ther-ex   [employer]) Educated Patient   Methods Explanation;Demonstration;Tactile cues;Verbal cues   Comprehension Returned demonstration;Verbalized understanding        Objectives   There-ex  L posterior capsule stretch 10x5 second holds   Quadruped: scapular protraction and retraction 10x3  Prayer stretch to promote shoulder flexion ROM 10x5 seconds for 3 sets   Quadruped: L shoulder weight shifting 10x5 seconds  Supine with half bolster behind back vertically  Bilateral shoulder in about 90 degrees flexion: scapular protraction/retracion (10x5 seconds for 3 sets)  Supine L shoulder flexion AAROM with PT assist 10x3. Stiff end feel  113 degrees supine L shoulder flexion AROM (92 degrees standing L shoulder flexion AROM) afterwards  L  shoulder IR resisting red band 10x3  L shoulder ER resisting yellow band 10x3   Improved exercise technique, movement at target joints, use of target muscles after mod verbal, visual, tactile cues.    Continued working on exercises to promote joint capsule mobility on L shoulder to decreased stiffness, to promote ability to raise L arm up with less neck/upper trap pain. Improved L shoulder flexion AROM in standing compared to eval measurement. Pt tolerated session well without aggravation of symptoms.         PT Long Term Goals - 03/20/17 1158      PT LONG TERM GOAL #1   Title Patient will have a decrease in L cervical/shoulder pain to 2/10 or less at worst to promote ability to raise her L  arm, as well as don and doff shirts and sweaters.    Baseline 6/10 L cervical and shoulder pain at worst (03/20/2017)   Time 6   Period Weeks   Status New   Target Date 05/03/17     PT LONG TERM GOAL #2   Title Patient will improve L shoulder flexion and abduction AROM to at least 110 degrees or more to promote to promote ability to raise her L arm and perform functional tasks.    Baseline L shoulder AROM: 85 degrees flexion, 72 degrees abduction (03/20/2017)   Time 6   Period Weeks   Status New   Target Date 05/03/17     PT LONG TERM GOAL #3   Title Patient will improve her Quick Dash score by at least 15% as a demonstration of improved function.    Baseline 34% (03/20/2017)   Time 6   Period Weeks   Status New   Target Date 05/03/17     PT LONG TERM GOAL #4   Title Patient will improve her Neck Disability Index Score by at least 12% as a demonstration of impoved function.    Baseline 22% (03/20/2017)   Time 6   Period Weeks   Status New   Target Date 05/03/17               Plan - 04/04/17 1054    Clinical Impression Statement Continued working on exercises to promote joint capsule mobility on L shoulder to decreased stiffness, to promote ability to raise L arm up with less neck/upper trap pain. Improved L shoulder flexion AROM in standing compared to eval measurement. Pt tolerated session well without aggravation of symptoms.    History and Personal Factors relevant to plan of care: Chronicity of condition, difficulty raising her L arm, donning and doffing shirts and sweaters   Clinical Presentation Stable   Clinical Presentation due to: Minimal to no starting L shoulder/neck pain today   Clinical Decision Making Low   Rehab Potential Good   Clinical Impairments Affecting Rehab Potential Chronicity of condition, hx of CA   PT Frequency 2x / week   PT Duration 6 weeks   PT Treatment/Interventions Therapeutic exercise;Therapeutic activities;Manual  techniques;Patient/family education;Neuromuscular re-education;Ultrasound   PT Next Visit Plan scapular strengthening, posterior L shoulder capsule stretch, L shoulder ROM, rotator cuff strengthening   Consulted and Agree with Plan of Care Patient      Patient will benefit from skilled therapeutic intervention in order to improve the following deficits and impairments:  Pain, Postural dysfunction, Improper body mechanics, Decreased strength, Decreased range of motion  Visit Diagnosis: Chronic left shoulder pain  Cervicalgia     Problem List Patient Active Problem List  Diagnosis Date Noted  . Hypertension 11/22/2016  . History of bilateral breast cancer 07/10/2009   Joneen Boers PT, DPT   04/04/2017, 8:01 PM  [facility] [address], [place], [address] Phone: [phone]   Fax:  [phone]  Name: Tanya Olson MRN: [id number] Date of Birth: 04-03-1956

## 2017-04-10 ENCOUNTER — Ambulatory Visit: Payer: BLUE CROSS/BLUE SHIELD | Attending: Medical

## 2017-04-10 DIAGNOSIS — M25512 Pain in left shoulder: Secondary | ICD-10-CM | POA: Insufficient documentation

## 2017-04-10 DIAGNOSIS — G8929 Other chronic pain: Secondary | ICD-10-CM | POA: Insufficient documentation

## 2017-04-10 DIAGNOSIS — M542 Cervicalgia: Secondary | ICD-10-CM | POA: Insufficient documentation

## 2017-04-10 NOTE — Therapy (Deleted)
[facility] [address] [place], [address] Phone: [phone]   Fax:  [phone]  Physical Therapy Treatment  Patient Details  Name: Tanya Olson MRN: [id number] Date of Birth: [birth year] Referring Provider: Daryll Drown, PA-C  Encounter Date: 04/10/2017      PT End of Session - 04/10/17 1036    Visit Number 5   Number of Visits 13   Date for PT Re-Evaluation 05/03/17   PT Start Time 1036   Activity Tolerance Patient tolerated treatment well   Behavior During Therapy [facility] for tasks assessed/performed      Past Medical History:  Diagnosis Date  . Breast cancer ([place]) 2011   Bilateral. S/P bilateral mastectomy with reconstruction  . Cancer ([place])   . Hypertension     Past Surgical History:  Procedure Laterality Date  . CHOLECYSTECTOMY, LAPAROSCOPIC      There were no vitals filed for this visit.      Subjective Assessment - 04/10/17 1038    Subjective Neck and shoulder is a little stiff in the morning. 5/10 dull/ache L shoulder area.   Pertinent History Neck and posterior shoulder pain with L sided weakness. Difficulty raising her L arm. Sudden onset around mid May 2018. Got a cervical MRI which revealed bone spurs around C5. One doctor recomended surgery, another doctor recommended PT. Pt wants to try PT first. Denies tingling or numbness.  Pt is R hand dominant. Has not had PT for her neck and L shoulder before. Denies unexplained changes in weight.  Pain hurts more at night than in the morning but pt can sleep. Pt sleeps on her R side currently. Can't sleep on her L side due to pain or feels like she is going to hurt it.  Pt adds seeing an oncologist regularly. Last appointment was on January 26, 2017. Sees her every 6 months. Pt also adds that a mengioma (benign) and thyroid nodules were seen with the MRI which are also benign and no spread of the CA. No UE paresthesias.    Patient Stated Goals I  would love to be able to have perfect ROM, no pain, be back to how she was before May 2018 (no problems using her L UE), not have surgery.    Currently in Pain? Yes   Pain Score 5   4-5/10 dull ache   Pain Onset More than a month ago                                 Objectives   There-ex               L posterior capsule stretch 10x5 second holds              Quadruped: scapular protraction and retraction 10x3  Prayer stretch to promote shoulder flexion ROM 10x5 seconds for 3 sets              Quadruped: L shoulder weight shifting 10x5 seconds  Supine with half bolster behind back vertically             Bilateral shoulder in about 90 degrees flexion: scapular protraction/retracion (10x5 seconds for 3 sets)  Supine L shoulder flexion AAROM with PT assist 10x3. Stiff end feel             113 degrees supine L shoulder flexion AROM (92 degrees standing L shoulder flexion AROM) afterwards  L shoulder IR resisting red band 10x3  L shoulder ER resisting yellow band 10x3   Improved exercise technique, movement at target joints, use of target muscles after mod verbal, visual, tactile cues.        PT Long Term Goals - 03/20/17 1158      PT LONG TERM GOAL #1   Title Patient will have a decrease in L cervical/shoulder pain to 2/10 or less at worst to promote ability to raise her L arm, as well as don and doff shirts and sweaters.    Baseline 6/10 L cervical and shoulder pain at worst (03/20/2017)   Time 6   Period Weeks   Status New   Target Date 05/03/17     PT LONG TERM GOAL #2   Title Patient will improve L shoulder flexion and abduction AROM to at least 110 degrees or more to promote to promote ability to raise her L arm and perform functional tasks.    Baseline L shoulder AROM: 85 degrees flexion, 72 degrees abduction (03/20/2017)   Time 6   Period Weeks   Status New   Target Date 05/03/17     PT LONG TERM GOAL #3   Title Patient  will improve her Quick Dash score by at least 15% as a demonstration of improved function.    Baseline 34% (03/20/2017)   Time 6   Period Weeks   Status New   Target Date 05/03/17     PT LONG TERM GOAL #4   Title Patient will improve her Neck Disability Index Score by at least 12% as a demonstration of impoved function.    Baseline 22% (03/20/2017)   Time 6   Period Weeks   Status New   Target Date 05/03/17             Patient will benefit from skilled therapeutic intervention in order to improve the following deficits and impairments:     Visit Diagnosis: No diagnosis found.     Problem List Patient Active Problem List   Diagnosis Date Noted  . Hypertension 11/22/2016  . History of bilateral breast cancer 07/10/2009    Tanya Olson 04/10/2017, 12:38 PM  [facility] [address], [place], [address] Phone: [phone]   Fax:  [phone]  Name: Tanya Olson MRN: [id number] Date of Birth: May 02, 1956

## 2017-04-10 NOTE — Patient Instructions (Signed)
  Push-up position L shoulder weight shifting    (Leaning forward with your arms straight on an elevated surface such as your bed    Shift your body weight onto your left side comfortably.     Hold for 5 seconds.    Repeat 10 times.    Do 3 sets.     Perform 2 times daily. ). Pt demonstrated and verbalized understanding.

## 2017-04-10 NOTE — Therapy (Signed)
[facility] [address] [place], [address] Phone: [phone]   Fax:  [phone]  Physical Therapy Treatment  Patient Details  Name: Tanya Olson MRN: [id number] Date of Birth: 01-Jul-1956 Referring Provider: Daryll Drown, PA-C  Encounter Date: 04/10/2017      PT End of Session - 04/10/17 1036    Visit Number 5   Number of Visits 13   Date for PT Re-Evaluation 05/03/17   PT Start Time 0973   PT Stop Time 1118   PT Time Calculation (min) 42 min   Activity Tolerance Patient tolerated treatment well   Behavior During Therapy [facility] for tasks assessed/performed      Past Medical History:  Diagnosis Date  . Breast cancer ([place]) 2011   Bilateral. S/P bilateral mastectomy with reconstruction  . Cancer ([place])   . Hypertension     Past Surgical History:  Procedure Laterality Date  . CHOLECYSTECTOMY, LAPAROSCOPIC      There were no vitals filed for this visit.      Subjective Assessment - 04/10/17 1038    Subjective Neck and shoulder is a little stiff in the morning. 5/10 dull/ache L shoulder area.   Pertinent History Neck and posterior shoulder pain with L sided weakness. Difficulty raising her L arm. Sudden onset around mid May 2018. Got a cervical MRI which revealed bone spurs around C5. One doctor recomended surgery, another doctor recommended PT. Pt wants to try PT first. Denies tingling or numbness.  Pt is R hand dominant. Has not had PT for her neck and L shoulder before. Denies unexplained changes in weight.  Pain hurts more at night than in the morning but pt can sleep. Pt sleeps on her R side currently. Can't sleep on her L side due to pain or feels like she is going to hurt it.  Pt adds seeing an oncologist regularly. Last appointment was on January 26, 2017. Sees her every 6 months. Pt also adds that a mengioma (benign) and thyroid nodules were seen with the MRI which are also benign and no spread of  the CA. No UE paresthesias.    Patient Stated Goals I would love to be able to have perfect ROM, no pain, be back to how she was before May 2018 (no problems using her L UE), not have surgery.    Currently in Pain? Yes   Pain Score 5   4-5/10 dull ache   Pain Onset More than a month ago                                 PT Education - 04/10/17 1241    Education provided Yes   Education Details ther-ex, HEP   Person(s) Educated Patient   Methods Explanation;Demonstration;Tactile cues;Verbal cues   Comprehension Returned demonstration;Verbalized understanding        Objectives   Ther-ex L cross-arm stretch 5x2 with 10 second holds L towel IR stretch 10x10 seconds L shoulder IR resisting red band 10x3 with 5 second holds  L shoulder flexion AROM 86 degrees   L shoulder ER resisting yellow band 10x3 Push-up position at 27 inch high table  L weight shift onto L shoulder 10x5 seconds for 3 sets. Pt states L shoulder felt more loose afterwards.    (Leaning forward with your arms straight on an elevated surface such as your bed    Shift your body weight  onto your left side comfortably.     Hold for 5 seconds.    Repeat 10 times.    Do 3 sets.     Perform 2 times daily. ). Pt demonstrated and verbalized understanding.      Pendulums, with 2.5 lbs around wrist  Clockwise 1 min  Counterclockwise 1 min  Quadruped position   Scapular retraction 10x5 seconds for 2 sets  Prayer stretch 10x5 seconds   105 degrees L shoulder flexion AAROM in that position.      Improved exercise technique, movement at target joints, use of target muscles after min to mod verbal, visual, tactile cues.     Continued working on improving L shoulder joint mobility secondary to stiffness. Pt tolerated session well without aggravation of symptoms. Demonstrates L shoulder shrug and L upper trap muscle use compensation with L shoulder flexion secondary to stiffness.           PT Long Term Goals - 03/20/17 1158      PT LONG TERM GOAL #1   Title Patient will have a decrease in L cervical/shoulder pain to 2/10 or less at worst to promote ability to raise her L arm, as well as don and doff shirts and sweaters.    Baseline 6/10 L cervical and shoulder pain at worst (03/20/2017)   Time 6   Period Weeks   Status New   Target Date 05/03/17     PT LONG TERM GOAL #2   Title Patient will improve L shoulder flexion and abduction AROM to at least 110 degrees or more to promote to promote ability to raise her L arm and perform functional tasks.    Baseline L shoulder AROM: 85 degrees flexion, 72 degrees abduction (03/20/2017)   Time 6   Period Weeks   Status New   Target Date 05/03/17     PT LONG TERM GOAL #3   Title Patient will improve her Quick Dash score by at least 15% as a demonstration of improved function.    Baseline 34% (03/20/2017)   Time 6   Period Weeks   Status New   Target Date 05/03/17     PT LONG TERM GOAL #4   Title Patient will improve her Neck Disability Index Score by at least 12% as a demonstration of impoved function.    Baseline 22% (03/20/2017)   Time 6   Period Weeks   Status New   Target Date 05/03/17               Plan - 04/10/17 1243    Clinical Impression Statement Continued working on improving L shoulder joint mobility secondary to stiffness. Pt tolerated session well without aggravation of symptoms. Demonstrates L shoulder shrug and L upper trap muscle use compensation with L shoulder flexion secondary to stiffness.    History and Personal Factors relevant to plan of care: Chronicity of condition, difficulty raising her L arm, donning and doffing shirts and sweaters   Clinical Presentation Stable   Clinical Presentation due to: Pt tolerated session well without aggravation of symptoms.   Clinical Decision Making Low   Rehab Potential Good   Clinical Impairments Affecting Rehab Potential Chronicity of condition, hx of CA    PT Frequency 2x / week   PT Duration 6 weeks   PT Treatment/Interventions Therapeutic exercise;Therapeutic activities;Manual techniques;Patient/family education;Neuromuscular re-education;Ultrasound   PT Next Visit Plan scapular strengthening, posterior L shoulder capsule stretch, L shoulder ROM, rotator cuff strengthening   Consulted and Agree with  Plan of Care Patient      Patient will benefit from skilled therapeutic intervention in order to improve the following deficits and impairments:  Pain, Postural dysfunction, Improper body mechanics, Decreased strength, Decreased range of motion  Visit Diagnosis: Cervicalgia  Chronic left shoulder pain     Problem List Patient Active Problem List   Diagnosis Date Noted  . Hypertension 11/22/2016  . History of bilateral breast cancer 07/10/2009    Joneen Boers PT, DPT   04/10/2017, 12:51 PM  [facility] [address] [place], [address] Phone: [phone]   Fax:  [phone]  Name: Tanya Olson MRN: [id number] Date of Birth: Sep 23, 1955

## 2017-04-12 ENCOUNTER — Ambulatory Visit: Payer: BLUE CROSS/BLUE SHIELD

## 2017-04-12 DIAGNOSIS — M25512 Pain in left shoulder: Secondary | ICD-10-CM

## 2017-04-12 DIAGNOSIS — M542 Cervicalgia: Secondary | ICD-10-CM

## 2017-04-12 DIAGNOSIS — G8929 Other chronic pain: Secondary | ICD-10-CM

## 2017-04-12 NOTE — Therapy (Signed)
[facility] [address], [place], [address] Phone: [phone]   Fax:  [phone]  Physical Therapy Treatment  Patient Details  Name: Tanya Olson MRN: [id number] Date of Birth: 31-Aug-1955 Referring Provider: Daryll Drown, PA-C  Encounter Date: 04/12/2017      PT End of Session - 04/12/17 1037    Visit Number 6   Number of Visits 13   Date for PT Re-Evaluation 05/03/17   PT Start Time 6712   PT Stop Time 1118   PT Time Calculation (min) 41 min   Activity Tolerance Patient tolerated treatment well   Behavior During Therapy [facility] for tasks assessed/performed      Past Medical History:  Diagnosis Date  . Breast cancer ([place]) 2011   Bilateral. S/P bilateral mastectomy with reconstruction  . Cancer ([place])   . Hypertension     Past Surgical History:  Procedure Laterality Date  . CHOLECYSTECTOMY, LAPAROSCOPIC      There were no vitals filed for this visit.      Subjective Assessment - 04/12/17 1038    Subjective Neck and shoulder are not too bad. Felt stiff this morning. Did exercises yesterday. Feels soreness and stiffnes (L upper trap muscle; 3/10). 6/10 at worst usually, at night. During the day, L shoulder/neck is a 3-4/10. Feels more stiff and achy. Not sharp. Donning and doffing a sweater is a little easier.      Pertinent History Neck and posterior shoulder pain with L sided weakness. Difficulty raising her L arm. Sudden onset around mid May 2018. Got a cervical MRI which revealed bone spurs around C5. One doctor recomended surgery, another doctor recommended PT. Pt wants to try PT first. Denies tingling or numbness.  Pt is R hand dominant. Has not had PT for her neck and L shoulder before. Denies unexplained changes in weight.  Pain hurts more at night than in the morning but pt can sleep. Pt sleeps on her R side currently. Can't sleep on her L side due to pain or feels like she is going to hurt  it.  Pt adds seeing an oncologist regularly. Last appointment was on January 26, 2017. Sees her every 6 months. Pt also adds that a mengioma (benign) and thyroid nodules were seen with the MRI which are also benign and no spread of the CA. No UE paresthesias.    Patient Stated Goals I would love to be able to have perfect ROM, no pain, be back to how she was before May 2018 (no problems using her L UE), not have surgery.    Currently in Pain? Yes   Pain Score 3   L upper trap muscle soreness   Pain Onset More than a month ago            [facility] PT Assessment - 04/12/17 1110      AROM   Left Shoulder Flexion 92 Degrees  no pain   Left Shoulder ABduction 82 Degrees  scaption; no pain                             PT Education - 04/12/17 1055    Education provided Yes   Education Details ther-ex   [employer]) Educated Patient   Methods Explanation;Demonstration;Tactile cues;Verbal cues   Comprehension Returned demonstration;Verbalized understanding        Objectives   6/10 at worst usually, at night. During the day, L shoulder/neck  is a 3-4/10. Feels more stiff and achy. Not sharp. Donning and doffing a sweater is a little easier.     Ther-ex  Supine L shoulder AAROM with PT  Flexion 10x3  scaption 10x3  ER (in scapular plane) 10x3  IR (in scapular plane) 10x3  L cross-arm stretch 5x with 10 second holds  L towel IR stretch 10x10 seconds  [place]   Flexion 10x  scaption 10x  L shoulder IR resisting red band 10x3 with 5 second holds L shoulder ER resisting yellow band 10x2 with 5 second holds.    Push-up position at 24 inch high table             L weight shift onto L shoulder 10x5 seconds     Pendulums, with 2.5 lbs around wrist             Clockwise 1 min             Counterclockwise 1 min   Improved exercise technique, movement at target joints, use of target muscles after min to mod verbal, visual, tactile cues.     Improved L  shoulder flexion and scaption AROM since initial evaluation. Also decreased difficulty donning and doffing a sweater per pt subjective reports. Pt tolerated session well without aggraavation of symptoms. Continued working on decreasing L shoulder stiffness to promote ability to raise her L arm up with less upper trap muscle compensation.            PT Long Term Goals - 03/20/17 1158      PT LONG TERM GOAL #1   Title Patient will have a decrease in L cervical/shoulder pain to 2/10 or less at worst to promote ability to raise her L arm, as well as don and doff shirts and sweaters.    Baseline 6/10 L cervical and shoulder pain at worst (03/20/2017)   Time 6   Period Weeks   Status New   Target Date 05/03/17     PT LONG TERM GOAL #2   Title Patient will improve L shoulder flexion and abduction AROM to at least 110 degrees or more to promote to promote ability to raise her L arm and perform functional tasks.    Baseline L shoulder AROM: 85 degrees flexion, 72 degrees abduction (03/20/2017)   Time 6   Period Weeks   Status New   Target Date 05/03/17     PT LONG TERM GOAL #3   Title Patient will improve her Quick Dash score by at least 15% as a demonstration of improved function.    Baseline 34% (03/20/2017)   Time 6   Period Weeks   Status New   Target Date 05/03/17     PT LONG TERM GOAL #4   Title Patient will improve her Neck Disability Index Score by at least 12% as a demonstration of impoved function.    Baseline 22% (03/20/2017)   Time 6   Period Weeks   Status New   Target Date 05/03/17               Plan - 04/12/17 1055    Clinical Impression Statement Improved L shoulder flexion and scaption AROM since initial evaluation. Also decreased difficulty donning and doffing a sweater per pt subjective reports. Pt tolerated session well without aggraavation of symptoms. Continued working on decreasing L shoulder stiffness to promote ability to raise her L arm up with less  upper trap muscle compensation.    History and Personal Factors  relevant to plan of care: Chronicity of condition, difficulty raising her L arm, donning and doffing shirts and sweaters   Clinical Presentation Stable   Clinical Presentation due to: Pt tolerated session well without aggravation of symptoms.    Clinical Decision Making Low   Rehab Potential Good   Clinical Impairments Affecting Rehab Potential Chronicity of condition, hx of CA   PT Frequency 2x / week   PT Duration 6 weeks   PT Treatment/Interventions Therapeutic exercise;Therapeutic activities;Manual techniques;Patient/family education;Neuromuscular re-education;Ultrasound   PT Next Visit Plan scapular strengthening, posterior L shoulder capsule stretch, L shoulder ROM, rotator cuff strengthening   Consulted and Agree with Plan of Care Patient      Patient will benefit from skilled therapeutic intervention in order to improve the following deficits and impairments:  Pain, Postural dysfunction, Improper body mechanics, Decreased strength, Decreased range of motion  Visit Diagnosis: Cervicalgia  Chronic left shoulder pain     Problem List Patient Active Problem List   Diagnosis Date Noted  . Hypertension 11/22/2016  . History of bilateral breast cancer 07/10/2009    Joneen Boers PT, DPT   04/12/2017, 6:33 PM  [facility] [address] [place], [address] Phone: [phone]   Fax:  [phone]  Name: Tanya Olson MRN: [id number] Date of Birth: August 19, 1955

## 2017-04-17 ENCOUNTER — Ambulatory Visit: Payer: BLUE CROSS/BLUE SHIELD

## 2017-04-17 DIAGNOSIS — M542 Cervicalgia: Secondary | ICD-10-CM | POA: Diagnosis not present

## 2017-04-17 DIAGNOSIS — M25512 Pain in left shoulder: Secondary | ICD-10-CM | POA: Diagnosis not present

## 2017-04-17 DIAGNOSIS — G8929 Other chronic pain: Secondary | ICD-10-CM

## 2017-04-17 NOTE — Therapy (Signed)
[place] [place] [facility] [address] [place], [address] Phone: [phone]   Fax:  [phone]  Physical Therapy Treatment  Patient Details  Name: Tanya Olson MRN: [id number] Date of Birth: August 22, 1955 Referring Provider: Daryll Drown, PA-C  Encounter Date: 04/17/2017      PT End of Session - 04/17/17 1036    Visit Number 7   Number of Visits 13   Date for PT Re-Evaluation 05/03/17   PT Start Time 1036   PT Stop Time 1117   PT Time Calculation (min) 41 min   Activity Tolerance Patient tolerated treatment well   Behavior During Therapy [facility] for tasks assessed/performed      Past Medical History:  Diagnosis Date  . Breast cancer ([place]) 2011   Bilateral. S/P bilateral mastectomy with reconstruction  . Cancer ([place])   . Hypertension     Past Surgical History:  Procedure Laterality Date  . CHOLECYSTECTOMY, LAPAROSCOPIC      There were no vitals filed for this visit.      Subjective Assessment - 04/17/17 1036    Subjective Today neck and shoulder does not feel too bad. 3/10 currently.    Pertinent History Neck and posterior shoulder pain with L sided weakness. Difficulty raising her L arm. Sudden onset around mid May 2018. Got a cervical MRI which revealed bone spurs around C5. One doctor recomended surgery, another doctor recommended PT. Pt wants to try PT first. Denies tingling or numbness.  Pt is R hand dominant. Has not had PT for her neck and L shoulder before. Denies unexplained changes in weight.  Pain hurts more at night than in the morning but pt can sleep. Pt sleeps on her R side currently. Can't sleep on her L side due to pain or feels like she is going to hurt it.  Pt adds seeing an oncologist regularly. Last appointment was on January 26, 2017. Sees her every 6 months. Pt also adds that a mengioma (benign) and thyroid nodules were seen with the MRI which are also benign and no spread of the CA. No UE  paresthesias.    Patient Stated Goals I would love to be able to have perfect ROM, no pain, be back to how she was before May 2018 (no problems using her L UE), not have surgery.    Currently in Pain? Yes   Pain Score 3    Pain Onset More than a month ago                                 PT Education - 04/17/17 1040    Education provided Yes   Education Details ther-ex   [employer]) Educated Patient   Methods Demonstration;Tactile cues;Explanation;Verbal cues   Comprehension Returned demonstration;Verbalized understanding        Objectives     Ther-ex  Supine L shoulder AAROM with PT             Flexion 10x3             scaption 10x3             ER (in scapular plane) 10x3             IR (in scapular plane) 10x3   Push-up position at 24 inch high table L weight shift onto L shoulder 10x2 with 5 second holds    Pendulums, with 2.5 lbs around wrist  to promote increase in shoulder joint space Clockwise 1 min Counterclockwise 1 min  With 3 lb weight at left distal arm above elbow to promote increase in shoulder joint space  L shoulder IR resisting red band 10x5 seconds for 3 sets  L shoulder ER resisting yellow band 10x3  Prayer stretch 10x3 with 5 second holds  Reviewed and given as part of her HEP. Pt demonstrated and verbalized understanding   Seated manually resisted L scapular retraction targeting the lower trap muscles 10x5 seconds for 3 sets   Improved exercise technique, movement at target joints, use of target muscles after min to mod verbal, visual, tactile cues.       Continued working on improving L shoulder joint mobility to promote flexion and scaption AROM to decrease L upper trap compensation and neck pain. Also worked on lower trap strengthening to decrease upper trap muscle tension.  Pt tolerated session well without aggravation of symptoms.            PT Long Term Goals -  03/20/17 1158      PT LONG TERM GOAL #1   Title Patient will have a decrease in L cervical/shoulder pain to 2/10 or less at worst to promote ability to raise her L arm, as well as don and doff shirts and sweaters.    Baseline 6/10 L cervical and shoulder pain at worst (03/20/2017)   Time 6   Period Weeks   Status New   Target Date 05/03/17     PT LONG TERM GOAL #2   Title Patient will improve L shoulder flexion and abduction AROM to at least 110 degrees or more to promote to promote ability to raise her L arm and perform functional tasks.    Baseline L shoulder AROM: 85 degrees flexion, 72 degrees abduction (03/20/2017)   Time 6   Period Weeks   Status New   Target Date 05/03/17     PT LONG TERM GOAL #3   Title Patient will improve her Quick Dash score by at least 15% as a demonstration of improved function.    Baseline 34% (03/20/2017)   Time 6   Period Weeks   Status New   Target Date 05/03/17     PT LONG TERM GOAL #4   Title Patient will improve her Neck Disability Index Score by at least 12% as a demonstration of impoved function.    Baseline 22% (03/20/2017)   Time 6   Period Weeks   Status New   Target Date 05/03/17               Plan - 04/17/17 1041    Clinical Impression Statement Continued working on improving L shoulder joint mobility to promote flexion and scaption AROM to decrease L upper trap compensation and neck pain. Also worked on lower trap strengthening to decrease upper trap muscle tension.  Pt tolerated session well without aggravation of symptoms.    History and Personal Factors relevant to plan of care: Chronicity of condition, difficulty raising her L arm, donning and doffing shirts and sweaters   Clinical Presentation Stable   Clinical Presentation due to: Pt tolerated session well without aggravation of symptoms   Clinical Decision Making Low   Rehab Potential Good   Clinical Impairments Affecting Rehab Potential Chronicity of condition, hx of  CA   PT Frequency 2x / week   PT Duration 6 weeks   PT Treatment/Interventions Therapeutic exercise;Therapeutic activities;Manual techniques;Patient/family education;Neuromuscular re-education;Ultrasound   PT Next Visit  Plan scapular strengthening, posterior L shoulder capsule stretch, L shoulder ROM, rotator cuff strengthening   Consulted and Agree with Plan of Care Patient      Patient will benefit from skilled therapeutic intervention in order to improve the following deficits and impairments:  Pain, Postural dysfunction, Improper body mechanics, Decreased strength, Decreased range of motion  Visit Diagnosis: Cervicalgia  Chronic left shoulder pain     Problem List Patient Active Problem List   Diagnosis Date Noted  . Hypertension 11/22/2016  . History of bilateral breast cancer 07/10/2009    Joneen Boers PT, DPT   04/17/2017, 12:27 PM  [facility] [address] [place], [address] Phone: [phone]   Fax:  [phone]  Name: Tanya Olson MRN: [id number] Date of Birth: 20-Mar-1956

## 2017-04-17 NOTE — Patient Instructions (Signed)
  Kneeling prayer stretch  Kneeling on "all fours"   Keep your hands stationary as you gently sit on your heels to feel a gentle stretch at your left shoulder   Hold for 5 seconds   Repeat 10 times   Perform 3 sets daily.

## 2017-04-26 ENCOUNTER — Ambulatory Visit: Payer: BLUE CROSS/BLUE SHIELD

## 2017-04-26 DIAGNOSIS — G8929 Other chronic pain: Secondary | ICD-10-CM | POA: Diagnosis not present

## 2017-04-26 DIAGNOSIS — M25512 Pain in left shoulder: Secondary | ICD-10-CM | POA: Diagnosis not present

## 2017-04-26 DIAGNOSIS — M542 Cervicalgia: Secondary | ICD-10-CM | POA: Diagnosis not present

## 2017-04-26 NOTE — Therapy (Signed)
[place] [facility] [address], [place], [address] Phone: [phone]   Fax:  [phone]  Physical Therapy Treatment  Patient Details  Name: Tanya Olson MRN: [id number] Date of Birth: [birth year] Referring Provider: Daryll Drown, PA-C  Encounter Date: 04/26/2017      PT End of Session - 04/26/17 1034    Visit Number 8   Number of Visits 13   Date for PT Re-Evaluation 05/03/17   PT Start Time 1034   PT Stop Time 1119   PT Time Calculation (min) 45 min   Activity Tolerance Patient tolerated treatment well   Behavior During Therapy [facility] for tasks assessed/performed      Past Medical History:  Diagnosis Date  . Breast cancer ([place]) 2011   Bilateral. S/P bilateral mastectomy with reconstruction  . Cancer ([place])   . Hypertension     Past Surgical History:  Procedure Laterality Date  . CHOLECYSTECTOMY, LAPAROSCOPIC      There were no vitals filed for this visit.      Subjective Assessment - 04/26/17 1035    Subjective Neck and shoulder does not feel so bad right now. Would hurt at times during the day at times (6-7/10). Did her exercises at that time which would help. The exercises loosens her shoulder but it stiffens up again.  3/10 L shoulder pain currently.  Might schedule an orthopedic doctor appointment.    Pertinent History Neck and posterior shoulder pain with L sided weakness. Difficulty raising her L arm. Sudden onset around mid May 2018. Got a cervical MRI which revealed bone spurs around C5. One doctor recomended surgery, another doctor recommended PT. Pt wants to try PT first. Denies tingling or numbness.  Pt is R hand dominant. Has not had PT for her neck and L shoulder before. Denies unexplained changes in weight.  Pain hurts more at night than in the morning but pt can sleep. Pt sleeps on her R side currently. Can't sleep on her L side due to pain or feels like she is going to hurt it.  Pt adds  seeing an oncologist regularly. Last appointment was on January 26, 2017. Sees her every 6 months. Pt also adds that a mengioma (benign) and thyroid nodules were seen with the MRI which are also benign and no spread of the CA. No UE paresthesias.    Patient Stated Goals I would love to be able to have perfect ROM, no pain, be back to how she was before May 2018 (no problems using her L UE), not have surgery.    Currently in Pain? Yes   Pain Score 3    Pain Onset More than a month ago                                 PT Education - 04/26/17 1101    Education provided Yes   Education Details ther-ex. HEP   Person(s) Educated Patient   Methods Explanation;Demonstration;Tactile cues;Verbal cues;Handout   Comprehension Returned demonstration;Verbalized understanding        Objectives   Manual therapy  STM L upper trap muscle to decrease tension  Decreased pain per pt.      Ther-ex  Standing bilateral T-band rows yellow band 10x3  Reviewed and given as part of her HEP. Pt demonstrated and verbalized understanding. Handout provided.   Standing bilateral shoulder extension 10x3 with scapular retraction  Seated press  ups 10x3  L shoulder IR resisting red band 10x3  Push-up position at 24inch high table L weight shift onto L shoulder 10x3 with 5 second holds   L shoulder towel/pillow case slides flexion up the wall to promote ROM 10x3  Improved exercise technique, movement at target joints, use of target muscles after min to mod verbal, visual, tactile cues.     Continued working on scapular strengthening to decrease upper trap muscle tension, as well as exercises to promote shoulder joint mobility and shoulder ROM to promote ability to raise her L arm up against gravity with less shoulder shrug compensation. Decreased L shoulder discomfort following STM to decrease L upper trap muscle tension. Pt tolerated session well without aggravation  of symptoms.         PT Long Term Goals - 03/20/17 1158      PT LONG TERM GOAL #1   Title Patient will have a decrease in L cervical/shoulder pain to 2/10 or less at worst to promote ability to raise her L arm, as well as don and doff shirts and sweaters.    Baseline 6/10 L cervical and shoulder pain at worst (03/20/2017)   Time 6   Period Weeks   Status New   Target Date 05/03/17     PT LONG TERM GOAL #2   Title Patient will improve L shoulder flexion and abduction AROM to at least 110 degrees or more to promote to promote ability to raise her L arm and perform functional tasks.    Baseline L shoulder AROM: 85 degrees flexion, 72 degrees abduction (03/20/2017)   Time 6   Period Weeks   Status New   Target Date 05/03/17     PT LONG TERM GOAL #3   Title Patient will improve her Quick Dash score by at least 15% as a demonstration of improved function.    Baseline 34% (03/20/2017)   Time 6   Period Weeks   Status New   Target Date 05/03/17     PT LONG TERM GOAL #4   Title Patient will improve her Neck Disability Index Score by at least 12% as a demonstration of impoved function.    Baseline 22% (03/20/2017)   Time 6   Period Weeks   Status New   Target Date 05/03/17               Plan - 04/26/17 1103    Clinical Impression Statement Continued working on scapular strengthening to decrease upper trap muscle tension, as well as exercises to promote shoulder joint mobility and shoulder ROM to promote ability to raise her L arm up against gravity with less shoulder shrug compensation. Decreased L shoulder discomfort following STM to decrease L upper trap muscle tension. Pt tolerated session well without aggravation of symptoms.     History and Personal Factors relevant to plan of care: Chronicity of condition, difficulty raising her L arm, donning and doffing shirts and sweaters   Clinical Presentation Stable   Clinical Presentation due to: Pt tolerated session well without  aggravation of symptoms. Decreased pain with STM to L upper trap to decrease muscle tension.    Clinical Decision Making Low   Rehab Potential Good   Clinical Impairments Affecting Rehab Potential Chronicity of condition, hx of CA   PT Frequency 2x / week   PT Duration 6 weeks   PT Treatment/Interventions Therapeutic exercise;Therapeutic activities;Manual techniques;Patient/family education;Neuromuscular re-education;Ultrasound   PT Next Visit Plan scapular strengthening, posterior L shoulder capsule stretch,  L shoulder ROM, rotator cuff strengthening   Consulted and Agree with Plan of Care Patient      Patient will benefit from skilled therapeutic intervention in order to improve the following deficits and impairments:  Pain, Postural dysfunction, Improper body mechanics, Decreased strength, Decreased range of motion  Visit Diagnosis: Cervicalgia  Chronic left shoulder pain     Problem List Patient Active Problem List   Diagnosis Date Noted  . Hypertension 11/22/2016  . History of bilateral breast cancer 07/10/2009   Joneen Boers PT, DPT   04/26/2017, 12:30 PM  [facility] [address] [place], [address] Phone: [phone]   Fax:  [phone]  Name: Tanya Olson MRN: [id number] Date of Birth: 1956/07/06

## 2017-04-26 NOTE — Patient Instructions (Signed)
Scapular Retraction: Rowing (Eccentric) - Arms - 45 Degrees (Resistance Band)   Hold end of band in each hand. Pull back until elbows are even with trunk.  Hold for 0 - 5 seconds. Use ____yellow____ resistance band. _10__ reps per set, _3__ sets per day. Copyright  [employer]. All rights reserved.       Scapular Retraction (Standing)   With arms at sides, pinch shoulder blades together. Hold for 5 seconds. Repeat __10__ times per set. Do __3__ sets per session.     Copyright  VHI. All rights reserved.

## 2017-05-01 ENCOUNTER — Ambulatory Visit: Payer: BLUE CROSS/BLUE SHIELD

## 2017-05-01 DIAGNOSIS — M25512 Pain in left shoulder: Secondary | ICD-10-CM | POA: Diagnosis not present

## 2017-05-01 DIAGNOSIS — M542 Cervicalgia: Secondary | ICD-10-CM

## 2017-05-01 DIAGNOSIS — G8929 Other chronic pain: Secondary | ICD-10-CM | POA: Diagnosis not present

## 2017-05-01 NOTE — Patient Instructions (Signed)
  Supine shoulder flexion   Lying on your back, holding a water bottle  Raise your left arm up and back to feel a stretch at your shoulder.    Repeat 10 times   Perform 3 sets daily.     You can also perform this diagonally.

## 2017-05-01 NOTE — Therapy (Signed)
[facility] [address] [place], [address] Phone: [phone]   Fax:  [phone]  Physical Therapy Treatment  Patient Details  Name: Tanya Olson MRN: [id number] Date of Birth: [birth year] Referring Provider: Daryll Drown, PA-C  Encounter Date: 05/01/2017      PT End of Session - 05/01/17 1037    Visit Number 9   Number of Visits 25   Date for PT Re-Evaluation 06/14/17   PT Start Time 3559   PT Stop Time 1118   PT Time Calculation (min) 41 min   Activity Tolerance Patient tolerated treatment well   Behavior During Therapy [facility] for tasks assessed/performed      Past Medical History:  Diagnosis Date  . Breast cancer ([place]) 2011   Bilateral. S/P bilateral mastectomy with reconstruction  . Cancer ([place])   . Hypertension     Past Surgical History:  Procedure Laterality Date  . CHOLECYSTECTOMY, LAPAROSCOPIC      There were no vitals filed for this visit.      Subjective Assessment - 05/01/17 1039    Subjective Neck and shoulder is a little stiff, 3-4/10 discomfort/pain currently. Donning and doffing a jacket is a little better.    Pertinent History Neck and posterior shoulder pain with L sided weakness. Difficulty raising her L arm. Sudden onset around mid May 2018. Got a cervical MRI which revealed bone spurs around C5. One doctor recomended surgery, another doctor recommended PT. Pt wants to try PT first. Denies tingling or numbness.  Pt is R hand dominant. Has not had PT for her neck and L shoulder before. Denies unexplained changes in weight.  Pain hurts more at night than in the morning but pt can sleep. Pt sleeps on her R side currently. Can't sleep on her L side due to pain or feels like she is going to hurt it.  Pt adds seeing an oncologist regularly. Last appointment was on January 26, 2017. Sees her every 6 months. Pt also adds that a mengioma (benign) and thyroid nodules were seen with the MRI  which are also benign and no spread of the CA. No UE paresthesias.    Patient Stated Goals I would love to be able to have perfect ROM, no pain, be back to how she was before May 2018 (no problems using her L UE), not have surgery.    Currently in Pain? Yes   Pain Score 4    Pain Onset More than a month ago            [facility] PT Assessment - 05/01/17 1102      Observation/Other Assessments   Neck Disability Index  18%   Quick DASH  22.72%     AROM   Overall AROM Comments No pain, just stretch sensation.    Left Shoulder Flexion 104 Degrees  with L shoulder shrug compensation   Left Shoulder ABduction 104 Degrees  with shoulder shrug compensation; Scaption                              PT Education - 05/01/17 1059    Education provided Yes   Education Details ther-ex, HEP   Person(s) Educated Patient   Methods Explanation;Demonstration;Tactile cues;Verbal cues   Comprehension Returned demonstration;Verbalized understanding        Objectives   5/10 L shoulder/neck pain at most for the past 7 days. Feels like she needs to  come to more PT sessions for neck and shoulder.    There-ex  Supine AAROM L shoulder with PT 10x3 each way  Flexion  scaption  ER/IR in scapular plane  Supine L shoulder AROM resisting 1 lb weight to promote strength and ROM  Flexion 10x3  scaption 10x3  Reviewed plan of care: continue with more PT 2x/week for 6 weeks   L shoulder IR resisting red band 10x2 with 5 second holds  Bilateral shoulder ER resisting yellow band 10x5 seconds for 2 sets   Standing bilateral rows yellow T-band 10x5 seconds   Improved exercise technique, movement at target joints, use of target muscles after min to mod verbal, visual, tactile cues.   Pt demonstrates improved L shoulder flexion and scaption AROM, function, and slight decrease in L shoulder and neck pain since initial evaluation. Pt still demonstrates limited L shoulder AROM, neck  and shoulder pain, and difficulty performing functional tasks such as donning and doffing coats and raising her L arm and would benefit from continued PT to address the aforementioned deficits.            PT Long Term Goals - 05/01/17 1222      PT LONG TERM GOAL #1   Title Patient will have a decrease in L cervical/shoulder pain to 2/10 or less at worst to promote ability to raise her L arm, as well as don and doff shirts and sweaters.    Baseline 6/10 L cervical and shoulder pain at worst (03/20/2017); 5/10 at worst for the past 7 days (05/01/2017)   Time 6   Period Weeks   Status On-going   Target Date 06/14/17     PT LONG TERM GOAL #2   Title Patient will improve L shoulder flexion and abduction AROM to at least 110 degrees or more to promote to promote ability to raise her L arm and perform functional tasks.    Baseline L shoulder AROM: 85 degrees flexion, 72 degrees abduction (03/20/2017); 104 degrees flexion and scaption AROM with shoulder shrug compensation (05/01/2017)   Time 6   Period Weeks   Status Partially Met   Target Date 06/14/17     PT LONG TERM GOAL #3   Title Patient will improve her Quick Dash score by at least 15% as a demonstration of improved function.    Baseline 34% (03/20/2017); 22.72% (05/01/2017)   Time 6   Period Weeks   Status Partially Met   Target Date 06/14/17     PT LONG TERM GOAL #4   Title Patient will improve her Neck Disability Index Score by at least 12% as a demonstration of impoved function.    Baseline 22% (03/20/2017); 18% (05/01/2017)   Time 6   Period Weeks   Status Partially Met   Target Date 06/14/17               Plan - 05/01/17 1059    Clinical Impression Statement Pt demonstrates improved L shoulder flexion and scaption AROM, function, and slight decrease in L shoulder and neck pain since initial evaluation. Pt still demonstrates limited L shoulder AROM, neck and shoulder pain, and difficulty performing functional  tasks such as donning and doffing coats and raising her L arm and would benefit from continued PT to address the aforementioned deficits.    History and Personal Factors relevant to plan of care: Chronicity of condition, difficulty raising her L arm, donning and doffing shirts and sweaters   Clinical Presentation Stable   Clinical  Presentation due to: improving ROM and function   Clinical Decision Making Low   Rehab Potential Good   Clinical Impairments Affecting Rehab Potential Chronicity of condition, hx of CA   PT Frequency 2x / week   PT Duration 6 weeks   PT Treatment/Interventions Therapeutic exercise;Therapeutic activities;Manual techniques;Patient/family education;Neuromuscular re-education;Ultrasound   PT Next Visit Plan scapular strengthening, posterior L shoulder capsule stretch, L shoulder ROM, rotator cuff strengthening   Consulted and Agree with Plan of Care Patient      Patient will benefit from skilled therapeutic intervention in order to improve the following deficits and impairments:  Pain, Postural dysfunction, Improper body mechanics, Decreased strength, Decreased range of motion  Visit Diagnosis: Cervicalgia - Plan: PT plan of care cert/re-cert  Chronic left shoulder pain - Plan: PT plan of care cert/re-cert     Problem List Patient Active Problem List   Diagnosis Date Noted  . Hypertension 11/22/2016  . History of bilateral breast cancer 07/10/2009    Joneen Boers PT, DPT   05/01/2017, 12:37 PM  [place] [facility] [address], [place], [address] Phone: [phone]   Fax:  [phone]  Name: Keonna Raether MRN: [id number] Date of Birth: 23-Jun-1956

## 2017-05-03 ENCOUNTER — Ambulatory Visit: Payer: BLUE CROSS/BLUE SHIELD

## 2017-05-03 DIAGNOSIS — M25512 Pain in left shoulder: Secondary | ICD-10-CM | POA: Diagnosis not present

## 2017-05-03 DIAGNOSIS — G8929 Other chronic pain: Secondary | ICD-10-CM

## 2017-05-03 DIAGNOSIS — M542 Cervicalgia: Secondary | ICD-10-CM

## 2017-05-03 NOTE — Therapy (Signed)
[facility] [address] [place], [address] Phone: [phone]   Fax:  [phone]  Physical Therapy Treatment  Patient Details  Name: Tanya Olson MRN: [id number] Date of Birth: 07/01/1956 Referring Provider: Daryll Drown, PA-C  Encounter Date: 05/03/2017      PT End of Session - 05/03/17 1036    Visit Number 10   Number of Visits 25   Date for PT Re-Evaluation 06/14/17   PT Start Time 1036   PT Stop Time 1118   PT Time Calculation (min) 42 min   Activity Tolerance Patient tolerated treatment well   Behavior During Therapy [facility] for tasks assessed/performed      Past Medical History:  Diagnosis Date  . Breast cancer ([place]) 2011   Bilateral. S/P bilateral mastectomy with reconstruction  . Cancer ([place])   . Hypertension     Past Surgical History:  Procedure Laterality Date  . CHOLECYSTECTOMY, LAPAROSCOPIC      There were no vitals filed for this visit.      Subjective Assessment - 05/03/17 1039    Subjective Neck is stiff. L shoulder was really sore after last session. L shoulder is a little sore today but feels what we did Tuesday was helpful.  4-5/10 L neck/shoulder pain currently. Made an appointment with a shoulder doctor in [place] during the week of Thanksgiving.     Pertinent History Neck and posterior shoulder pain with L sided weakness. Difficulty raising her L arm. Sudden onset around mid May 2018. Got a cervical MRI which revealed bone spurs around C5. One doctor recomended surgery, another doctor recommended PT. Pt wants to try PT first. Denies tingling or numbness.  Pt is R hand dominant. Has not had PT for her neck and L shoulder before. Denies unexplained changes in weight.  Pain hurts more at night than in the morning but pt can sleep. Pt sleeps on her R side currently. Can't sleep on her L side due to pain or feels like she is going to hurt it.  Pt adds seeing an oncologist  regularly. Last appointment was on January 26, 2017. Sees her every 6 months. Pt also adds that a mengioma (benign) and thyroid nodules were seen with the MRI which are also benign and no spread of the CA. No UE paresthesias.    Patient Stated Goals I would love to be able to have perfect ROM, no pain, be back to how she was before May 2018 (no problems using her L UE), not have surgery.    Currently in Pain? Yes   Pain Score 5   4-5/10   Pain Onset More than a month ago                                 PT Education - 05/03/17 1049    Education provided Yes   Education Details ther-ex   [employer]) Educated Patient   Methods Explanation;Demonstration;Tactile cues;Verbal cues   Comprehension Returned demonstration;Verbalized understanding        Objectives   Made an appointment with a shoulder doctor in [place] during the week of Thanksgiving.     There-ex  Supine AAROM L shoulder with PT 10x3 each way             Flexion             scaption  ER/IR in scapular plane  Supine L shoulder AROM resisting 1 lb weight to promote strength and ROM             Flexion 10x3             scaption 10x3  Manually resisted seated scapular retraction targeting lower trap 10x5 seconds for 2 sets   Improved exercise technique, movement at target joints, use of target muscles after min to mod verbal, visual, tactile cues.     Manual therapy              STM L upper trap muscle to decrease tension in sitting            Decreased L upper trap tension after manual therapy. Continued working on L shoulder ROM to decrease L upper trap compensation and shoulder shrug when raising her L arm up. Pt tolerated session well without aggarvation of symptoms.             PT Long Term Goals - 05/01/17 1222      PT LONG TERM GOAL #1   Title Patient will have a decrease in L cervical/shoulder pain to 2/10 or less at worst to promote ability to raise her L  arm, as well as don and doff shirts and sweaters.    Baseline 6/10 L cervical and shoulder pain at worst (03/20/2017); 5/10 at worst for the past 7 days (05/01/2017)   Time 6   Period Weeks   Status On-going   Target Date 06/14/17     PT LONG TERM GOAL #2   Title Patient will improve L shoulder flexion and abduction AROM to at least 110 degrees or more to promote to promote ability to raise her L arm and perform functional tasks.    Baseline L shoulder AROM: 85 degrees flexion, 72 degrees abduction (03/20/2017); 104 degrees flexion and scaption AROM with shoulder shrug compensation (05/01/2017)   Time 6   Period Weeks   Status Partially Met   Target Date 06/14/17     PT LONG TERM GOAL #3   Title Patient will improve her Quick Dash score by at least 15% as a demonstration of improved function.    Baseline 34% (03/20/2017); 22.72% (05/01/2017)   Time 6   Period Weeks   Status Partially Met   Target Date 06/14/17     PT LONG TERM GOAL #4   Title Patient will improve her Neck Disability Index Score by at least 12% as a demonstration of impoved function.    Baseline 22% (03/20/2017); 18% (05/01/2017)   Time 6   Period Weeks   Status Partially Met   Target Date 06/14/17               Plan - 05/03/17 1050    Clinical Impression Statement Decreased L upper trap tension after manual therapy. Continued working on L shoulder ROM to decrease L upper trap compensation and shoulder shrug when raising her L arm up. Pt tolerated session well without aggarvation of symptoms.    History and Personal Factors relevant to plan of care: Chronicity of condition, difficulty raising her L arm, donning and doffing shirts and sweaters   Clinical Presentation Stable   Clinical Presentation due to: Pt tolerated session well without aggravation of symptoms   Clinical Decision Making Low   Rehab Potential Good   Clinical Impairments Affecting Rehab Potential Chronicity of condition, hx of CA   PT  Frequency 2x / week   PT Duration  6 weeks   PT Treatment/Interventions Therapeutic exercise;Therapeutic activities;Manual techniques;Patient/family education;Neuromuscular re-education;Ultrasound   PT Next Visit Plan scapular strengthening, posterior L shoulder capsule stretch, L shoulder ROM, rotator cuff strengthening   Consulted and Agree with Plan of Care Patient      Patient will benefit from skilled therapeutic intervention in order to improve the following deficits and impairments:  Pain, Postural dysfunction, Improper body mechanics, Decreased strength, Decreased range of motion  Visit Diagnosis: Cervicalgia  Chronic left shoulder pain     Problem List Patient Active Problem List   Diagnosis Date Noted  . Hypertension 11/22/2016  . History of bilateral breast cancer 07/10/2009    Joneen Boers PT, DPT   05/03/2017, 1:43 PM  [facility] [address] [place], [address] Phone: [phone]   Fax:  [phone]  Name: Tanya Olson MRN: [id number] Date of Birth: July 16, 1955

## 2017-05-08 ENCOUNTER — Ambulatory Visit: Payer: BLUE CROSS/BLUE SHIELD

## 2017-05-08 DIAGNOSIS — G8929 Other chronic pain: Secondary | ICD-10-CM

## 2017-05-08 DIAGNOSIS — M542 Cervicalgia: Secondary | ICD-10-CM

## 2017-05-08 DIAGNOSIS — M25512 Pain in left shoulder: Secondary | ICD-10-CM

## 2017-05-08 NOTE — Therapy (Signed)
[facility] [address] [place], [address] Phone: [phone]   Fax:  [phone]  Physical Therapy Treatment  Patient Details  Name: Tanya Olson MRN: [id number] Date of Birth: [birth year] Referring Provider: Daryll Drown, PA-C  Encounter Date: 05/08/2017      PT End of Session - 05/08/17 1039    Visit Number 11   Number of Visits 25   Date for PT Re-Evaluation 06/14/17   PT Start Time 1039  pt arrived late   PT Stop Time 1123   PT Time Calculation (min) 44 min   Activity Tolerance Patient tolerated treatment well   Behavior During Therapy [facility] for tasks assessed/performed      Past Medical History:  Diagnosis Date  . Breast cancer ([place]) 2011   Bilateral. S/P bilateral mastectomy with reconstruction  . Cancer ([place])   . Hypertension     Past Surgical History:  Procedure Laterality Date  . CHOLECYSTECTOMY, LAPAROSCOPIC      There were no vitals filed for this visit.      Subjective Assessment - 05/08/17 1040    Subjective Neck is feeling better. The L shoulder (posterior upper trap area) is sore (5/10 L shoulder pain currently).    Pertinent History Neck and posterior shoulder pain with L sided weakness. Difficulty raising her L arm. Sudden onset around mid May 2018. Got a cervical MRI which revealed bone spurs around C5. One doctor recomended surgery, another doctor recommended PT. Pt wants to try PT first. Denies tingling or numbness.  Pt is R hand dominant. Has not had PT for her neck and L shoulder before. Denies unexplained changes in weight.  Pain hurts more at night than in the morning but pt can sleep. Pt sleeps on her R side currently. Can't sleep on her L side due to pain or feels like she is going to hurt it.  Pt adds seeing an oncologist regularly. Last appointment was on January 26, 2017. Sees her every 6 months. Pt also adds that a mengioma (benign) and thyroid nodules were seen with  the MRI which are also benign and no spread of the CA. No UE paresthesias.    Patient Stated Goals I would love to be able to have perfect ROM, no pain, be back to how she was before May 2018 (no problems using her L UE), not have surgery.    Currently in Pain? Yes   Pain Score 5   L shoulder pain. Neck feels better.    Pain Onset More than a month ago                                 PT Education - 05/08/17 1056    Education provided Yes   Education Details ther-ex   [employer]) Educated Patient   Methods Explanation;Demonstration;Tactile cues;Verbal cues   Comprehension Returned demonstration;Verbalized understanding        Objectives    There-ex   Supine AAROM L shoulder with PT 10x3 each way Flexion scaption ER/IR in scapular plane   Supine L shoulder AROM resisting 1 lb weight to promote strength and ROM Flexion 10x3 scaption 10x3  Omega machine   Rows plate 5 with 2 lb dumbbell 10x3  Bilateral shoulder extension with scapular retraction resisting plate 5 with 2 lb dumbbell 10x3  L shoulder flexion with scapular control to < or = 90 degrees 10x2 L shoulder  scaption with scapular control to < or = 90 degrees 10x2  L shoulder with 3 lb weight at arm to promote capsular mobility with gravity assist  IR resisting red band 10x3  ER resisting yellow band 10x3   Improved exercise technique, movement at target joints, use of target muscles after min to mod verbal, visual, tactile cues.    Decreased L shoulder discomfort after session. Continued working on improving L shoulder AROM, and middle and lower trap muscle strengthening to promote ability to raise her L arm up against gravity with less pain.           PT Long Term Goals - 05/01/17 1222      PT LONG TERM GOAL #1   Title Patient will have a decrease in L cervical/shoulder pain to 2/10 or less at worst to  promote ability to raise her L arm, as well as don and doff shirts and sweaters.    Baseline 6/10 L cervical and shoulder pain at worst (03/20/2017); 5/10 at worst for the past 7 days (05/01/2017)   Time 6   Period Weeks   Status On-going   Target Date 06/14/17     PT LONG TERM GOAL #2   Title Patient will improve L shoulder flexion and abduction AROM to at least 110 degrees or more to promote to promote ability to raise her L arm and perform functional tasks.    Baseline L shoulder AROM: 85 degrees flexion, 72 degrees abduction (03/20/2017); 104 degrees flexion and scaption AROM with shoulder shrug compensation (05/01/2017)   Time 6   Period Weeks   Status Partially Met   Target Date 06/14/17     PT LONG TERM GOAL #3   Title Patient will improve her Quick Dash score by at least 15% as a demonstration of improved function.    Baseline 34% (03/20/2017); 22.72% (05/01/2017)   Time 6   Period Weeks   Status Partially Met   Target Date 06/14/17     PT LONG TERM GOAL #4   Title Patient will improve her Neck Disability Index Score by at least 12% as a demonstration of impoved function.    Baseline 22% (03/20/2017); 18% (05/01/2017)   Time 6   Period Weeks   Status Partially Met   Target Date 06/14/17               Plan - 05/08/17 1056    Clinical Impression Statement Decreased L shoulder discomfort after session. Continued working on improving L shoulder AROM, and middle and lower trap muscle strengthening to promote ability to raise her L arm up against gravity with less pain.    History and Personal Factors relevant to plan of care: Chronicity of condition, difficulty raising her L arm, donning and doffing shirts and sweaters   Clinical Presentation Stable   Clinical Presentation due to: Decreased L shoulder discomfort after session.    Clinical Decision Making Low   Rehab Potential Good   Clinical Impairments Affecting Rehab Potential Chronicity of condition, hx of CA   PT  Frequency 2x / week   PT Duration 6 weeks   PT Treatment/Interventions Therapeutic exercise;Therapeutic activities;Manual techniques;Patient/family education;Neuromuscular re-education;Ultrasound   PT Next Visit Plan scapular strengthening, posterior L shoulder capsule stretch, L shoulder ROM, rotator cuff strengthening   Consulted and Agree with Plan of Care Patient      Patient will benefit from skilled therapeutic intervention in order to improve the following deficits and impairments:  Pain, Postural dysfunction, Improper  body mechanics, Decreased strength, Decreased range of motion  Visit Diagnosis: Cervicalgia  Chronic left shoulder pain     Problem List Patient Active Problem List   Diagnosis Date Noted  . Hypertension 11/22/2016  . History of bilateral breast cancer 07/10/2009    Joneen Boers PT, DPT   05/08/2017, 11:34 AM  [facility] [address], [place], [address] Phone: [phone]   Fax:  [phone]  Name: Tanya Olson MRN: [id number] Date of Birth: 03-13-1956

## 2017-05-10 ENCOUNTER — Ambulatory Visit: Payer: BLUE CROSS/BLUE SHIELD | Attending: Medical

## 2017-05-10 DIAGNOSIS — M25512 Pain in left shoulder: Secondary | ICD-10-CM | POA: Insufficient documentation

## 2017-05-10 DIAGNOSIS — G8929 Other chronic pain: Secondary | ICD-10-CM

## 2017-05-10 DIAGNOSIS — M542 Cervicalgia: Secondary | ICD-10-CM | POA: Diagnosis not present

## 2017-05-10 NOTE — Therapy (Signed)
[facility] [address] [place], [address] Phone: [phone]   Fax:  [phone]  Physical Therapy Treatment  Patient Details  Name: Tanya Olson MRN: [id number] Date of Birth: 08-14-1955 Referring Provider: Daryll Drown, PA-C  Encounter Date: 05/10/2017      PT End of Session - 05/10/17 1040    Visit Number 12   Number of Visits 25   Date for PT Re-Evaluation 06/14/17   PT Start Time 7035   PT Stop Time 1116   PT Time Calculation (min) 36 min   Activity Tolerance Patient tolerated treatment well   Behavior During Therapy [facility] for tasks assessed/performed      Past Medical History:  Diagnosis Date  . Breast cancer ([place]) 2011   Bilateral. S/P bilateral mastectomy with reconstruction  . Cancer ([place])   . Hypertension     Past Surgical History:  Procedure Laterality Date  . CHOLECYSTECTOMY, LAPAROSCOPIC      There were no vitals filed for this visit.      Subjective Assessment - 05/10/17 1041    Subjective Neck and shoulder is about a 3.5/10 currently   Pertinent History Neck and posterior shoulder pain with L sided weakness. Difficulty raising her L arm. Sudden onset around mid May 2018. Got a cervical MRI which revealed bone spurs around C5. One doctor recomended surgery, another doctor recommended PT. Pt wants to try PT first. Denies tingling or numbness.  Pt is R hand dominant. Has not had PT for her neck and L shoulder before. Denies unexplained changes in weight.  Pain hurts more at night than in the morning but pt can sleep. Pt sleeps on her R side currently. Can't sleep on her L side due to pain or feels like she is going to hurt it.  Pt adds seeing an oncologist regularly. Last appointment was on January 26, 2017. Sees her every 6 months. Pt also adds that a mengioma (benign) and thyroid nodules were seen with the MRI which are also benign and no spread of the CA. No UE paresthesias.    Patient Stated Goals I would love to be able to have perfect ROM, no pain, be back to how she was before May 2018 (no problems using her L UE), not have surgery.    Currently in Pain? Yes   Pain Score 3   3.5/10   Pain Onset More than a month ago                                 PT Education - 05/10/17 1047    Education provided Yes   Education Details ther-ex   [employer]) Educated Patient   Methods Demonstration;Explanation;Tactile cues;Verbal cues   Comprehension Returned demonstration;Verbalized understanding        Objectives    There-ex   Supine AAROM L shoulder with PT 10x3 each way Flexion scaption ER/IR in scapular plane  Supine L shoulder flexion with gentle extension isometrics at end range  5x5 seconds for 2 sets   Supine L shoulder AROM resisting 1 lb weight to promote strength and ROM Flexion 10x2 scaption 10x2   Omega machine                         Bilateral shoulder extension with scapular retraction resisting plate 5 with 2 lb dumbbell 10x3  Lat pull down plate 5  with 2 lb dumbbell 3x. L shoulder discomfort  Rows plate 5 with 2 lb dumbbell 10x5 seconds.   TRX strap  Rows 6x    Improved exercise technique, movement at target joints, use of target muscles after min to mod verbal, visual, tactile cues.    Decreased starting neck/shoulder pain level compared to previous sessions. Continued working on increasing shoulder AROM to decrease L shoulder shrug compensation and therefore decreased neck and shoulder pain. Difficulty with scapular control R > L. Scapular control for L shoulder improving compared to previous sessions. Pt tolerated session well without aggravation of symptoms.         PT Long Term Goals - 05/01/17 1222      PT LONG TERM GOAL #1   Title Patient will have a decrease in L cervical/shoulder pain to 2/10 or less at worst to promote ability  to raise her L arm, as well as don and doff shirts and sweaters.    Baseline 6/10 L cervical and shoulder pain at worst (03/20/2017); 5/10 at worst for the past 7 days (05/01/2017)   Time 6   Period Weeks   Status On-going   Target Date 06/14/17     PT LONG TERM GOAL #2   Title Patient will improve L shoulder flexion and abduction AROM to at least 110 degrees or more to promote to promote ability to raise her L arm and perform functional tasks.    Baseline L shoulder AROM: 85 degrees flexion, 72 degrees abduction (03/20/2017); 104 degrees flexion and scaption AROM with shoulder shrug compensation (05/01/2017)   Time 6   Period Weeks   Status Partially Met   Target Date 06/14/17     PT LONG TERM GOAL #3   Title Patient will improve her Quick Dash score by at least 15% as a demonstration of improved function.    Baseline 34% (03/20/2017); 22.72% (05/01/2017)   Time 6   Period Weeks   Status Partially Met   Target Date 06/14/17     PT LONG TERM GOAL #4   Title Patient will improve her Neck Disability Index Score by at least 12% as a demonstration of impoved function.    Baseline 22% (03/20/2017); 18% (05/01/2017)   Time 6   Period Weeks   Status Partially Met   Target Date 06/14/17               Plan - 05/10/17 1048    Clinical Impression Statement Decreased starting neck/shoulder pain level compared to previous sessions. Continued working on increasing shoulder AROM to decrease L shoulder shrug compensation and therefore decreased neck and shoulder pain. Difficulty with scapular control R > L. Scapular control for L shoulder improving compared to previous sessions. Pt tolerated session well without aggravation of symptoms.    History and Personal Factors relevant to plan of care: Chronicity of condition, difficulty raising her L arm, donning and doffing shirts and sweaters   Clinical Presentation Stable   Clinical Presentation due to: Decreased starting pain level compared to  previous visits.    Clinical Decision Making Low   Rehab Potential Good   Clinical Impairments Affecting Rehab Potential Chronicity of condition, hx of CA   PT Frequency 2x / week   PT Duration 6 weeks   PT Treatment/Interventions Therapeutic exercise;Therapeutic activities;Manual techniques;Patient/family education;Neuromuscular re-education;Ultrasound   PT Next Visit Plan scapular strengthening, posterior L shoulder capsule stretch, L shoulder ROM, rotator cuff strengthening   Consulted and Agree with Plan of Care Patient  Patient will benefit from skilled therapeutic intervention in order to improve the following deficits and impairments:  Pain, Postural dysfunction, Improper body mechanics, Decreased strength, Decreased range of motion  Visit Diagnosis: Cervicalgia  Chronic left shoulder pain     Problem List Patient Active Problem List   Diagnosis Date Noted  . Hypertension 11/22/2016  . History of bilateral breast cancer 07/10/2009    Joneen Boers PT, DPT   05/10/2017, 3:14 PM  [facility] [address], [place], [address] Phone: [phone]   Fax:  [phone]  Name: Ricka Westra MRN: [id number] Date of Birth: October 14, 1955

## 2017-05-15 ENCOUNTER — Ambulatory Visit: Payer: BLUE CROSS/BLUE SHIELD

## 2017-05-15 DIAGNOSIS — G8929 Other chronic pain: Secondary | ICD-10-CM | POA: Diagnosis not present

## 2017-05-15 DIAGNOSIS — M25512 Pain in left shoulder: Secondary | ICD-10-CM | POA: Diagnosis not present

## 2017-05-15 DIAGNOSIS — M542 Cervicalgia: Secondary | ICD-10-CM | POA: Diagnosis not present

## 2017-05-15 NOTE — Therapy (Signed)
[facility] [address], [place], [address] Phone: [phone]   Fax:  [phone]  Physical Therapy Treatment  Patient Details  Name: Tanya Olson MRN: [id number] Date of Birth: 06/09/1956 Referring Provider: Daryll Drown, PA-C   Encounter Date: 05/15/2017  PT End of Session - 05/15/17 1036    Visit Number  13    Number of Visits  25    Date for PT Re-Evaluation  06/14/17    PT Start Time  1037    PT Stop Time  1119    PT Time Calculation (min)  42 min    Activity Tolerance  Patient tolerated treatment well    Behavior During Therapy  [facility] for tasks assessed/performed       Past Medical History:  Diagnosis Date  . Breast cancer ([place]) 2011   Bilateral. S/P bilateral mastectomy with reconstruction  . Cancer ([place])   . Hypertension     Past Surgical History:  Procedure Laterality Date  . CHOLECYSTECTOMY, LAPAROSCOPIC      There were no vitals filed for this visit.  Subjective Assessment - 05/15/17 1037    Subjective  Neck and shoulders are doing good today. 3/10 current. Does not have a lot of shoulder ROM but does not hurt as much.     Pertinent History  Neck and posterior shoulder pain with L sided weakness. Difficulty raising her L arm. Sudden onset around mid May 2018. Got a cervical MRI which revealed bone spurs around C5. One doctor recomended surgery, another doctor recommended PT. Pt wants to try PT first. Denies tingling or numbness.  Pt is R hand dominant. Has not had PT for her neck and L shoulder before. Denies unexplained changes in weight.  Pain hurts more at night than in the morning but pt can sleep. Pt sleeps on her R side currently. Can't sleep on her L side due to pain or feels like she is going to hurt it.  Pt adds seeing an oncologist regularly. Last appointment was on January 26, 2017. Sees her every 6 months. Pt also adds that a mengioma (benign) and thyroid nodules were seen with  the MRI which are also benign and no spread of the CA. No UE paresthesias.     Patient Stated Goals  I would love to be able to have perfect ROM, no pain, be back to how she was before May 2018 (no problems using her L UE), not have surgery.     Currently in Pain?  Yes    Pain Score  3     Pain Onset  More than a month ago                              PT Education - 05/15/17 1043    Education provided  Yes    Education Details  ther-ex    [employer]) Educated  Patient    Methods  Explanation;Demonstration;Tactile cues;Verbal cues    Comprehension  Verbalized understanding;Returned demonstration         Objectives    There-ex   Supine AAROM L shoulder with PT 10x3 each way Flexion scaption ER/IR in scapular plane  Supine L shoulder flexion with gentle extension isometrics at end range             5x5 seconds for 3 sets   Supine L shoulder AROM  weight to promote strength and ROM Flexion  5x  2 lbs, then 10x 1 lb scaption 5x  2 lbs, then 10x 1lb   Towel slides with step back     Flexion 10x 3-5 seconds    scaptino 10x 3-5 seconds  Regular towel slides for AAROM against gravity    Flexion 10x5 seconds    scaption 10x5 seconds       Omega machine               Rows plate 5 with 2 lb dumbbell 10x5 seconds.  For 2 sets Bilateral shoulder extension with scapular retraction resisting plate 5 with 2 lb dumbbell 10x3   Improved exercise technique, movement at target joints, use of target muscles after min to mod verbal, visual, tactile cues.   Decreasing starting L shoulder and neck pain compared to previous session. About 98 degrees shoulder flexion and scaption AROM today which is a little less compared to most recent measurements but better than initial evaluation measurements. Pt tolerated session well without aggravation of symptoms. Stiff end feel L shoulder joint.         PT Long Term Goals - 05/01/17 1222      PT LONG TERM GOAL #1   Title  Patient will have a decrease in L cervical/shoulder pain to 2/10 or less at worst to promote ability to raise her L arm, as well as don and doff shirts and sweaters.     Baseline  6/10 L cervical and shoulder pain at worst (03/20/2017); 5/10 at worst for the past 7 days (05/01/2017)    Time  6    Period  Weeks    Status  On-going    Target Date  06/14/17      PT LONG TERM GOAL #2   Title  Patient will improve L shoulder flexion and abduction AROM to at least 110 degrees or more to promote to promote ability to raise her L arm and perform functional tasks.     Baseline  L shoulder AROM: 85 degrees flexion, 72 degrees abduction (03/20/2017); 104 degrees flexion and scaption AROM with shoulder shrug compensation (05/01/2017)    Time  6    Period  Weeks    Status  Partially Met    Target Date  06/14/17      PT LONG TERM GOAL #3   Title  Patient will improve her Quick Dash score by at least 15% as a demonstration of improved function.     Baseline  34% (03/20/2017); 22.72% (05/01/2017)    Time  6    Period  Weeks    Status  Partially Met    Target Date  06/14/17      PT LONG TERM GOAL #4   Title  Patient will improve her Neck Disability Index Score by at least 12% as a demonstration of impoved function.     Baseline  22% (03/20/2017); 18% (05/01/2017)    Time  6    Period  Weeks    Status  Partially Met    Target Date  06/14/17            Plan - 05/15/17 1052    Clinical Impression Statement  Decreasing starting L shoulder and neck pain compared to previous session. About 98 degrees shoulder flexion and scaption AROM today which is a little less compared to most recent measurements but better than initial evaluation measurements. Pt tolerated session well without aggravation of symptoms. Stiff end feel L shoulder joint.     History and  Personal Factors relevant to plan of care:  Chronicity of  condition, difficulty raising her L arm, donning and doffing shirts and sweaters    Clinical Presentation  Stable    Clinical Presentation due to:  improved starting L shoulder pain level    Clinical Decision Making  Low    Rehab Potential  Good    Clinical Impairments Affecting Rehab Potential  Chronicity of condition, hx of CA    PT Frequency  2x / week    PT Duration  6 weeks    PT Treatment/Interventions  Therapeutic exercise;Therapeutic activities;Manual techniques;Patient/family education;Neuromuscular re-education;Ultrasound    PT Next Visit Plan  scapular strengthening, posterior L shoulder capsule stretch, L shoulder ROM, rotator cuff strengthening    Consulted and Agree with Plan of Care  Patient       Patient will benefit from skilled therapeutic intervention in order to improve the following deficits and impairments:  Pain, Postural dysfunction, Improper body mechanics, Decreased strength, Decreased range of motion  Visit Diagnosis: Cervicalgia  Chronic left shoulder pain     Problem List Patient Active Problem List   Diagnosis Date Noted  . Hypertension 11/22/2016  . History of bilateral breast cancer 07/10/2009   Joneen Boers PT, DPT   05/15/2017, 2:51 PM  [facility] [address], [place], [address] Phone: [phone]   Fax:  [phone]  Name: Tanya Olson MRN: [id number] Date of Birth: 12-26-1955

## 2017-05-22 ENCOUNTER — Ambulatory Visit: Payer: BLUE CROSS/BLUE SHIELD

## 2017-05-22 DIAGNOSIS — G8929 Other chronic pain: Secondary | ICD-10-CM | POA: Diagnosis not present

## 2017-05-22 DIAGNOSIS — M25512 Pain in left shoulder: Secondary | ICD-10-CM | POA: Diagnosis not present

## 2017-05-22 DIAGNOSIS — M542 Cervicalgia: Secondary | ICD-10-CM

## 2017-05-22 NOTE — Therapy (Signed)
[facility] [address] [place], [address] Phone: [phone]   Fax:  [phone]  Physical Therapy Treatment  Patient Details  Name: Tanya Olson MRN: [id number] Date of Birth: 1956/01/18 Referring Provider: Daryll Drown, PA-C   Encounter Date: 05/22/2017  PT End of Session - 05/22/17 1035    Visit Number  14    Number of Visits  25    Date for PT Re-Evaluation  06/14/17    PT Start Time  1572    PT Stop Time  1115    PT Time Calculation (min)  40 min    Activity Tolerance  Patient tolerated treatment well    Behavior During Therapy  [facility] for tasks assessed/performed       Past Medical History:  Diagnosis Date  . Breast cancer ([place]) 2011   Bilateral. S/P bilateral mastectomy with reconstruction  . Cancer ([place])   . Hypertension     Past Surgical History:  Procedure Laterality Date  . CHOLECYSTECTOMY, LAPAROSCOPIC      There were no vitals filed for this visit.  Subjective Assessment - 05/22/17 1037    Subjective  It's been feeling better. 2.5/10. The L shoulder ROM is not as good but it does not hurt as much.     Pertinent History  Neck and posterior shoulder pain with L sided weakness. Difficulty raising her L arm. Sudden onset around mid May 2018. Got a cervical MRI which revealed bone spurs around C5. One doctor recomended surgery, another doctor recommended PT. Pt wants to try PT first. Denies tingling or numbness.  Pt is R hand dominant. Has not had PT for her neck and L shoulder before. Denies unexplained changes in weight.  Pain hurts more at night than in the morning but pt can sleep. Pt sleeps on her R side currently. Can't sleep on her L side due to pain or feels like she is going to hurt it.  Pt adds seeing an oncologist regularly. Last appointment was on January 26, 2017. Sees her every 6 months. Pt also adds that a mengioma (benign) and thyroid nodules were seen with the MRI which are  also benign and no spread of the CA. No UE paresthesias.     Patient Stated Goals  I would love to be able to have perfect ROM, no pain, be back to how she was before May 2018 (no problems using her L UE), not have surgery.     Currently in Pain?  Yes    Pain Score  3  2.5/10    Pain Onset  More than a month ago                              PT Education - 05/22/17 1042    Education provided  Yes    Education Details  ther-ex    [employer]) Educated  Patient    Methods  Explanation;Tactile cues;Verbal cues;Demonstration    Comprehension  Verbalized understanding;Returned demonstration         Objectives    There-ex   Supine AAROM L shoulder with PT 10x3 each way Flexion scaption ER/IR in scapular plane  Supine L shoulder flexion with gentle extension isometrics at end range 5x5 seconds for 3 sets    Supine L shoulder AROM  weight to promote strength and ROM Flexion 5x3  2 lbs scaption 5x3  2 lbs  standing rows resisting blue band 10x3 with 5 second holds  standing bilateral shoulder extension resisting green band 10x3   UE ranger L shoulder     Flexion 10x2    scaption 10x2   Improved exercise technique, movement at target joints, use of target muscles after min to mod verbal, visual, tactile cues.   Pt making progress towards decreasing L neck/shoulder pain. Still demonstrates limited L shoulder ROM but improves with exercise (observed). Pt tolerated session well without aggravation of symptoms. Stiff end feel L shoulder. Continued working on scapular strengthening to help decrease L upper trap tension as well as working on improving L shoulder flexion and scaption ROM to help decrease shoulder shrug compensation.      PT Long Term Goals - 05/01/17 1222      PT LONG TERM GOAL #1   Title  Patient will have a decrease in L cervical/shoulder pain to 2/10 or  less at worst to promote ability to raise her L arm, as well as don and doff shirts and sweaters.     Baseline  6/10 L cervical and shoulder pain at worst (03/20/2017); 5/10 at worst for the past 7 days (05/01/2017)    Time  6    Period  Weeks    Status  On-going    Target Date  06/14/17      PT LONG TERM GOAL #2   Title  Patient will improve L shoulder flexion and abduction AROM to at least 110 degrees or more to promote to promote ability to raise her L arm and perform functional tasks.     Baseline  L shoulder AROM: 85 degrees flexion, 72 degrees abduction (03/20/2017); 104 degrees flexion and scaption AROM with shoulder shrug compensation (05/01/2017)    Time  6    Period  Weeks    Status  Partially Met    Target Date  06/14/17      PT LONG TERM GOAL #3   Title  Patient will improve her Quick Dash score by at least 15% as a demonstration of improved function.     Baseline  34% (03/20/2017); 22.72% (05/01/2017)    Time  6    Period  Weeks    Status  Partially Met    Target Date  06/14/17      PT LONG TERM GOAL #4   Title  Patient will improve her Neck Disability Index Score by at least 12% as a demonstration of impoved function.     Baseline  22% (03/20/2017); 18% (05/01/2017)    Time  6    Period  Weeks    Status  Partially Met    Target Date  06/14/17            Plan - 05/22/17 1046    Clinical Impression Statement  Pt making progress towards decreasing L neck/shoulder pain. Still demonstrates limited L shoulder ROM but improves with exercise (observed). Pt tolerated session well without aggravation of symptoms. Stiff end feel L shoulder. Continued working on scapular strengthening to help decrease L upper trap tension as well as working on improving L shoulder flexion and scaption ROM to help decrease shoulder shrug compensation.     History and Personal Factors relevant to plan of care:  Chronicity of condition, difficulty raising her L arm, donning and doffing shirts and  sweaters     Clinical Presentation  Stable    Clinical Presentation due to:  decreased starting pain level    Clinical Decision Making  Low    Rehab Potential  Good    Clinical Impairments Affecting Rehab Potential  Chronicity of condition, hx of CA    PT Frequency  2x / week    PT Duration  6 weeks    PT Treatment/Interventions  Therapeutic exercise;Therapeutic activities;Manual techniques;Patient/family education;Neuromuscular re-education;Ultrasound    PT Next Visit Plan  scapular strengthening, posterior L shoulder capsule stretch, L shoulder ROM, rotator cuff strengthening    Consulted and Agree with Plan of Care  Patient       Patient will benefit from skilled therapeutic intervention in order to improve the following deficits and impairments:  Pain, Postural dysfunction, Improper body mechanics, Decreased strength, Decreased range of motion  Visit Diagnosis: Cervicalgia  Chronic left shoulder pain     Problem List Patient Active Problem List   Diagnosis Date Noted  . Hypertension 11/22/2016  . History of bilateral breast cancer 07/10/2009    Joneen Boers PT, DPT   05/22/2017, 12:24 PM  [facility] [address] [place], [address] Phone: [phone]   Fax:  [phone]  Name: Elnor Renovato MRN: [id number] Date of Birth: 02-05-1956

## 2017-05-24 ENCOUNTER — Ambulatory Visit: Payer: BLUE CROSS/BLUE SHIELD

## 2017-05-24 DIAGNOSIS — G8929 Other chronic pain: Secondary | ICD-10-CM | POA: Diagnosis not present

## 2017-05-24 DIAGNOSIS — M25512 Pain in left shoulder: Secondary | ICD-10-CM

## 2017-05-24 DIAGNOSIS — M542 Cervicalgia: Secondary | ICD-10-CM | POA: Diagnosis not present

## 2017-05-24 NOTE — Therapy (Signed)
[facility] [address] [place], [address] Phone: [phone]   Fax:  [phone]  Physical Therapy Treatment  Patient Details  Name: Tanya Olson MRN: [id number] Date of Birth: 08-19-1955 Referring Provider: Daryll Drown, PA-C   Encounter Date: 05/24/2017  PT End of Session - 05/24/17 1035    Visit Number  15    Number of Visits  25    Date for PT Re-Evaluation  06/14/17    PT Start Time  6440    PT Stop Time  1119    PT Time Calculation (min)  44 min    Activity Tolerance  Patient tolerated treatment well    Behavior During Therapy  [facility] for tasks assessed/performed       Past Medical History:  Diagnosis Date  . Breast cancer ([place]) 2011   Bilateral. S/P bilateral mastectomy with reconstruction  . Cancer ([place])   . Hypertension     Past Surgical History:  Procedure Laterality Date  . CHOLECYSTECTOMY, LAPAROSCOPIC      There were no vitals filed for this visit.  Subjective Assessment - 05/24/17 1037    Subjective  Neck and shoulder is about the same as last time, 2.5/10 to 3/10 currently. Pt sates her neck and shoulder are feeling better overall.     Pertinent History  Neck and posterior shoulder pain with L sided weakness. Difficulty raising her L arm. Sudden onset around mid May 2018. Got a cervical MRI which revealed bone spurs around C5. One doctor recomended surgery, another doctor recommended PT. Pt wants to try PT first. Denies tingling or numbness.  Pt is R hand dominant. Has not had PT for her neck and L shoulder before. Denies unexplained changes in weight.  Pain hurts more at night than in the morning but pt can sleep. Pt sleeps on her R side currently. Can't sleep on her L side due to pain or feels like she is going to hurt it.  Pt adds seeing an oncologist regularly. Last appointment was on January 26, 2017. Sees her every 6 months. Pt also adds that a mengioma (benign) and thyroid  nodules were seen with the MRI which are also benign and no spread of the CA. No UE paresthesias.     Patient Stated Goals  I would love to be able to have perfect ROM, no pain, be back to how she was before May 2018 (no problems using her L UE), not have surgery.     Currently in Pain?  Yes    Pain Score  3     Pain Onset  More than a month ago                              PT Education - 05/24/17 1041    Education provided  Yes    Education Details  ther-ex    [employer]) Educated  Patient    Methods  Explanation;Demonstration;Tactile cues;Verbal cues    Comprehension  Verbalized understanding;Returned demonstration          Objectives  Appointment with her orthopedic doctor in [place] is on 05/29/2017  Pt was recommended to ask her orthopedic MD to get clearance for PT to perform joint mobilizations to her L shoulder due to her medical history. Pt verbalized understanding.   Ther-ex  Omega machine    Rows plate 5 with 2 lb dumbbell 10x5 seconds.  For  2 sets  Bilateral shoulder extension with scapular retraction resisting plate 5 with 2 lb dumbbell 10x3 with 5 second holds  ER resisting yellow band 10x2  IR resisting red band 10x3  Seated ball rolls flexion 10x3 with 5 second holds  Supine AAROM L shoulder with PT 10x3 each way Flexion scaption ER/IR in scapular plane   Improved exercise technique, movement at target joints, use of target muscles after min to mod verbal, visual, tactile cues.   Improved ROM observed with AAROM exercises. Continued working on scapular strengthening to decrease tension to L upper trap muscles. Pt tolerated session well without aggravation of symptoms. Pt making progress towards pain goal.    PT Long Term Goals - 05/01/17 1222      PT LONG TERM GOAL #1   Title  Patient will have a decrease in L cervical/shoulder pain to 2/10 or less at worst to  promote ability to raise her L arm, as well as don and doff shirts and sweaters.     Baseline  6/10 L cervical and shoulder pain at worst (03/20/2017); 5/10 at worst for the past 7 days (05/01/2017)    Time  6    Period  Weeks    Status  On-going    Target Date  06/14/17      PT LONG TERM GOAL #2   Title  Patient will improve L shoulder flexion and abduction AROM to at least 110 degrees or more to promote to promote ability to raise her L arm and perform functional tasks.     Baseline  L shoulder AROM: 85 degrees flexion, 72 degrees abduction (03/20/2017); 104 degrees flexion and scaption AROM with shoulder shrug compensation (05/01/2017)    Time  6    Period  Weeks    Status  Partially Met    Target Date  06/14/17      PT LONG TERM GOAL #3   Title  Patient will improve her Quick Dash score by at least 15% as a demonstration of improved function.     Baseline  34% (03/20/2017); 22.72% (05/01/2017)    Time  6    Period  Weeks    Status  Partially Met    Target Date  06/14/17      PT LONG TERM GOAL #4   Title  Patient will improve her Neck Disability Index Score by at least 12% as a demonstration of impoved function.     Baseline  22% (03/20/2017); 18% (05/01/2017)    Time  6    Period  Weeks    Status  Partially Met    Target Date  06/14/17            Plan - 05/24/17 1041    Clinical Impression Statement  Improved ROM observed with AAROM exercises. Continued working on scapular strengthening to decrease tension to L upper trap muscles. Pt tolerated session well without aggravation of symptoms. Pt making progress towards pain goal.    History and Personal Factors relevant to plan of care:  Chronicity of condition, difficulty raising her L arm, donning and doffing shirts and sweaters     Clinical Presentation  Stable    Clinical Presentation due to:  Good carry over of decreased neck/shoulder pain    Clinical Decision Making  Low    Rehab Potential  Good    Clinical Impairments  Affecting Rehab Potential  Chronicity of condition, hx of CA    PT Frequency  2x / week  PT Duration  6 weeks    PT Treatment/Interventions  Therapeutic exercise;Therapeutic activities;Manual techniques;Patient/family education;Neuromuscular re-education;Ultrasound    PT Next Visit Plan  scapular strengthening, posterior L shoulder capsule stretch, L shoulder ROM, rotator cuff strengthening    Consulted and Agree with Plan of Care  Patient       Patient will benefit from skilled therapeutic intervention in order to improve the following deficits and impairments:  Pain, Postural dysfunction, Improper body mechanics, Decreased strength, Decreased range of motion  Visit Diagnosis: Cervicalgia  Chronic left shoulder pain     Problem List Patient Active Problem List   Diagnosis Date Noted  . Hypertension 11/22/2016  . History of bilateral breast cancer 07/10/2009   Joneen Boers PT, DPT   05/24/2017, 12:32 PM  [facility] [address], [place], [address] Phone: [phone]   Fax:  [phone]  Name: Tanya Olson MRN: [id number] Date of Birth: 22-Apr-1956

## 2017-05-29 DIAGNOSIS — M7502 Adhesive capsulitis of left shoulder: Secondary | ICD-10-CM | POA: Diagnosis not present

## 2017-05-29 DIAGNOSIS — G8929 Other chronic pain: Secondary | ICD-10-CM | POA: Diagnosis not present

## 2017-05-29 DIAGNOSIS — M19012 Primary osteoarthritis, left shoulder: Secondary | ICD-10-CM | POA: Diagnosis not present

## 2017-05-29 DIAGNOSIS — M25512 Pain in left shoulder: Secondary | ICD-10-CM | POA: Diagnosis not present

## 2017-05-30 DIAGNOSIS — E042 Nontoxic multinodular goiter: Secondary | ICD-10-CM | POA: Diagnosis not present

## 2017-05-30 DIAGNOSIS — E041 Nontoxic single thyroid nodule: Secondary | ICD-10-CM | POA: Diagnosis not present

## 2017-05-30 DIAGNOSIS — Z7189 Other specified counseling: Secondary | ICD-10-CM | POA: Diagnosis not present

## 2017-05-30 DIAGNOSIS — I10 Essential (primary) hypertension: Secondary | ICD-10-CM | POA: Diagnosis not present

## 2017-05-30 DIAGNOSIS — D34 Benign neoplasm of thyroid gland: Secondary | ICD-10-CM | POA: Diagnosis not present

## 2017-05-30 DIAGNOSIS — D329 Benign neoplasm of meninges, unspecified: Secondary | ICD-10-CM | POA: Diagnosis not present

## 2017-05-30 DIAGNOSIS — R29810 Facial weakness: Secondary | ICD-10-CM | POA: Diagnosis not present

## 2017-05-30 DIAGNOSIS — D496 Neoplasm of unspecified behavior of brain: Secondary | ICD-10-CM | POA: Diagnosis not present

## 2017-05-30 DIAGNOSIS — Z0189 Encounter for other specified special examinations: Secondary | ICD-10-CM | POA: Diagnosis not present

## 2017-05-30 DIAGNOSIS — Z853 Personal history of malignant neoplasm of breast: Secondary | ICD-10-CM | POA: Diagnosis not present

## 2017-05-30 DIAGNOSIS — M542 Cervicalgia: Secondary | ICD-10-CM | POA: Diagnosis not present

## 2017-05-30 DIAGNOSIS — G9389 Other specified disorders of brain: Secondary | ICD-10-CM | POA: Diagnosis not present

## 2017-06-05 DIAGNOSIS — M7502 Adhesive capsulitis of left shoulder: Secondary | ICD-10-CM | POA: Diagnosis not present

## 2017-06-07 ENCOUNTER — Ambulatory Visit: Payer: BLUE CROSS/BLUE SHIELD

## 2017-06-12 ENCOUNTER — Ambulatory Visit: Payer: BLUE CROSS/BLUE SHIELD | Attending: Medical

## 2017-06-12 DIAGNOSIS — M25512 Pain in left shoulder: Secondary | ICD-10-CM | POA: Diagnosis not present

## 2017-06-12 DIAGNOSIS — G8929 Other chronic pain: Secondary | ICD-10-CM | POA: Diagnosis not present

## 2017-06-12 DIAGNOSIS — M542 Cervicalgia: Secondary | ICD-10-CM | POA: Diagnosis not present

## 2017-06-12 NOTE — Therapy (Signed)
[facility] [address] [place], [address] Phone: [phone]   Fax:  [phone]  Physical Therapy Treatment  Patient Details  Name: Tanya Olson MRN: [id number] Date of Birth: [birth year] Referring Provider: Daryll Drown, PA-C   Encounter Date: 06/12/2017  PT End of Session - 06/12/17 1033    Visit Number  16    Number of Visits  25    Date for PT Re-Evaluation  06/14/17    PT Start Time  1033    PT Stop Time  1114    PT Time Calculation (min)  41 min    Activity Tolerance  Patient tolerated treatment well    Behavior During Therapy  [facility] for tasks assessed/performed       Past Medical History:  Diagnosis Date  . Breast cancer ([place]) 2011   Bilateral. S/P bilateral mastectomy with reconstruction  . Cancer ([place])   . Hypertension     Past Surgical History:  Procedure Laterality Date  . CHOLECYSTECTOMY, LAPAROSCOPIC      There were no vitals filed for this visit.  Subjective Assessment - 06/12/17 1034    Subjective  Got a cortisone shot which helped decrease pain. Had an MRI and x-ray for her L shoulder and everthing is fine. Was diagnosed with frozen shoulder.  Was told that the manual therapy is fine for her shoulder. 0/10 L shoulder pain currently.     Pertinent History  Neck and posterior shoulder pain with L sided weakness. Difficulty raising her L arm. Sudden onset around mid May 2018. Got a cervical MRI which revealed bone spurs around C5. One doctor recomended surgery, another doctor recommended PT. Pt wants to try PT first. Denies tingling or numbness.  Pt is R hand dominant. Has not had PT for her neck and L shoulder before. Denies unexplained changes in weight.  Pain hurts more at night than in the morning but pt can sleep. Pt sleeps on her R side currently. Can't sleep on her L side due to pain or feels like she is going to hurt it.  Pt adds seeing an oncologist regularly. Last appointment  was on January 26, 2017. Sees her every 6 months. Pt also adds that a mengioma (benign) and thyroid nodules were seen with the MRI which are also benign and no spread of the CA. No UE paresthesias.     Patient Stated Goals  I would love to be able to have perfect ROM, no pain, be back to how she was before May 2018 (no problems using her L UE), not have surgery.     Currently in Pain?  No/denies    Pain Score  0-No pain    Pain Onset  More than a month ago         [facility] PT Assessment - 06/12/17 1037      AROM   Left Shoulder Flexion  91 Degrees with shoulder shrug compensation    Left Shoulder ABduction  86 Degrees with shoulder shrug compensation                          PT Education - 06/12/17 1219    Education provided  Yes    Education Details  ther-ex, HEP    Person(s) Educated  Patient    Methods  Explanation;Demonstration;Tactile cues;Verbal cues    Comprehension  Returned demonstration;Verbalized understanding       Objectives  Manual therapy  Seated with L arm propped in scaption on table: posterior, inferior, posterior inferior glide grade 3 to 3+. Stiff posterior capsule palpated  Supine posterior, posterior inferior, inferior glide to L shoulder grade 3 to 3+ to promote mobility. Stiff inferior joint capsule paplated    Ther-ex  Reviewed plan of care: 2x/week for 6 week  Supine L shoulder AAROM and caudal pressure to joint with PT     Abduction 10x3    scaption 10x3    Flexion 10x3  Supine L shoulder flexion AROM 10x2   133 degrees flexion in supine Supine L shoulder scaption AROM 10x2    104 degrees scaption in supine  Standing L shoulder flexion 10x. Cues to not shrug    109 degrees flexinon AROM   Standing L shoulder scaption AROM 10x. Cues no not shrug     91 degrees standing scaption AROM   Improved exercise technique, movement at target joints, use of target muscles after min to mod verbal, visual, tactile cues.   Improved L  shoulder AROM after manual therapy to promote joint mobility and ROM exercises. Standing L shoulder AROM improved to 109 degrees flexion and 91 degrees scaption.         PT Long Term Goals - 05/01/17 1222      PT LONG TERM GOAL #1   Title  Patient will have a decrease in L cervical/shoulder pain to 2/10 or less at worst to promote ability to raise her L arm, as well as don and doff shirts and sweaters.     Baseline  6/10 L cervical and shoulder pain at worst (03/20/2017); 5/10 at worst for the past 7 days (05/01/2017)    Time  6    Period  Weeks    Status  On-going    Target Date  06/14/17      PT LONG TERM GOAL #2   Title  Patient will improve L shoulder flexion and abduction AROM to at least 110 degrees or more to promote to promote ability to raise her L arm and perform functional tasks.     Baseline  L shoulder AROM: 85 degrees flexion, 72 degrees abduction (03/20/2017); 104 degrees flexion and scaption AROM with shoulder shrug compensation (05/01/2017)    Time  6    Period  Weeks    Status  Partially Met    Target Date  06/14/17      PT LONG TERM GOAL #3   Title  Patient will improve her Quick Dash score by at least 15% as a demonstration of improved function.     Baseline  34% (03/20/2017); 22.72% (05/01/2017)    Time  6    Period  Weeks    Status  Partially Met    Target Date  06/14/17      PT LONG TERM GOAL #4   Title  Patient will improve her Neck Disability Index Score by at least 12% as a demonstration of impoved function.     Baseline  22% (03/20/2017); 18% (05/01/2017)    Time  6    Period  Weeks    Status  Partially Met    Target Date  06/14/17            Plan - 06/12/17 1032    Clinical Impression Statement  Improved L shoulder AROM after manual therapy to promote joint mobility and ROM exercises. Standing L shoulder AROM improved to 109 degrees flexion and 91 degrees scaption.    History and Personal Factors  relevant to plan of care:  Good carry over of  decreased neck/shoulder pain     Clinical Presentation  Stable    Clinical Presentation due to:  Improved L shoulder ROM    Clinical Decision Making  Low    Rehab Potential  Good    Clinical Impairments Affecting Rehab Potential  Chronicity of condition, hx of CA    PT Frequency  2x / week    PT Duration  6 weeks    PT Treatment/Interventions  Therapeutic exercise;Therapeutic activities;Manual techniques;Patient/family education;Neuromuscular re-education;Ultrasound    PT Next Visit Plan  scapular strengthening, posterior L shoulder capsule stretch, L shoulder ROM, rotator cuff strengthening    Consulted and Agree with Plan of Care  Patient       Patient will benefit from skilled therapeutic intervention in order to improve the following deficits and impairments:  Pain, Postural dysfunction, Improper body mechanics, Decreased strength, Decreased range of motion  Visit Diagnosis: Cervicalgia  Chronic left shoulder pain     Problem List Patient Active Problem List   Diagnosis Date Noted  . Hypertension 11/22/2016  . History of bilateral breast cancer 07/10/2009    Joneen Boers PT, DPT   06/12/2017, 12:28 PM  [facility] [address] [place], [address] Phone: [phone]   Fax:  [phone]  Name: Tanya Olson MRN: [id number] Date of Birth: 22-Jan-1956

## 2017-06-12 NOTE — Patient Instructions (Signed)
Pt was recommended to perform supine L shoulder flexion with 1 lb and scaption with 1 lb 10x2 each for 2 sessions daily to help maintain ROM gains. Pt demonstrated and verbalized understanding.

## 2017-06-14 ENCOUNTER — Ambulatory Visit: Payer: BLUE CROSS/BLUE SHIELD

## 2017-06-14 DIAGNOSIS — G8929 Other chronic pain: Secondary | ICD-10-CM | POA: Diagnosis not present

## 2017-06-14 DIAGNOSIS — M542 Cervicalgia: Secondary | ICD-10-CM

## 2017-06-14 DIAGNOSIS — M25512 Pain in left shoulder: Secondary | ICD-10-CM

## 2017-06-14 NOTE — Therapy (Signed)
[facility] [address], [place], [address] Phone: [phone]   Fax:  [phone]  Physical Therapy Treatment  Patient Details  Name: Tanya Olson MRN: [id number] Date of Birth: Jul 02, 1956 Referring Provider: Daryll Drown, PA-C   Encounter Date: 06/14/2017  PT End of Session - 06/14/17 1038    Visit Number  17    Number of Visits  37    Date for PT Re-Evaluation  07/26/17    PT Start Time  1038    PT Stop Time  1118    PT Time Calculation (min)  40 min    Activity Tolerance  Patient tolerated treatment well    Behavior During Therapy  [facility] for tasks assessed/performed       Past Medical History:  Diagnosis Date  . Breast cancer ([place]) 2011   Bilateral. S/P bilateral mastectomy with reconstruction  . Cancer ([place])   . Hypertension     Past Surgical History:  Procedure Laterality Date  . CHOLECYSTECTOMY, LAPAROSCOPIC      There were no vitals filed for this visit.  Subjective Assessment - 06/14/17 1039    Subjective  Neck and shoulder feel good. No pain currently. That cortisone shot I guess really worked. 2.5/10 L neck and shoulder pain at most for the past 7 days.     Pertinent History  Neck and posterior shoulder pain with L sided weakness. Difficulty raising her L arm. Sudden onset around mid May 2018. Got a cervical MRI which revealed bone spurs around C5. One doctor recomended surgery, another doctor recommended PT. Pt wants to try PT first. Denies tingling or numbness.  Pt is R hand dominant. Has not had PT for her neck and L shoulder before. Denies unexplained changes in weight.  Pain hurts more at night than in the morning but pt can sleep. Pt sleeps on her R side currently. Can't sleep on her L side due to pain or feels like she is going to hurt it.  Pt adds seeing an oncologist regularly. Last appointment was on January 26, 2017. Sees her every 6 months. Pt also adds that a mengioma (benign)  and thyroid nodules were seen with the MRI which are also benign and no spread of the CA. No UE paresthesias.     Patient Stated Goals  I would love to be able to have perfect ROM, no pain, be back to how she was before May 2018 (no problems using her L UE), not have surgery.     Currently in Pain?  No/denies    Pain Score  0-No pain    Pain Onset  More than a month ago         [facility] PT Assessment - 06/14/17 1412      Observation/Other Assessments   Neck Disability Index   6%    Quick DASH   22.72%                          PT Education - 06/14/17 1101    Education provided  Yes    Education Details  ther-ex, HEP    Person(s) Educated  Patient    Methods  Explanation;Demonstration;Tactile cues;Verbal cues;Handout    Comprehension  Returned demonstration;Verbalized understanding         Objectives  L shoulder AROM: 90 degrees flexion, 86 at start of session.   Manual therapy  Supine posterior, posterior inferior, inferior glide to L  shoulder grade 3 to 3+ to promote mobility. Stiff inferior joint capsule paplated     Ther-ex  Supine L shoulder AAROM and caudal pressure to joint with PT                           Flexion 10x3     Abduction 10x3                         scaption 10x3                         Towel slides     Flexion 10x5 seconds for 2 sets     scaption 10x5 seconds for 2 sets     Reviewed and given as part of her HEP. Handout provided. Pt demonstrated and verbalized understanding.    Standing L shoulder flexion 10x. Cues to not shrug                         107 degrees flexinon AROM   Standing L shoulder scaption AROM 10x. Cues no not shrug                          103 degrees standing scaption AROM   Improved exercise technique, movement at target joints, use of target muscles after mod verbal, visual, tactile cues.   Patient demonstrates improved L shoulder AROM following manual therapy to promote posterior and  inferior joint mobility with ROM exercises afterwards. Stiff end feel with ROM. Pt also demonstrates overall decreased neck pain  improved function since initial evaluation. Neck pain seems to improve with scapular strengthening and working on improving shoulder ROM, and ability to raise her arm up against gravity while decreasing shoulder shrug compensation. Pt still demonstrates neck pain, decreased mobility at her glenohumeral joint capsule, limited R shoulder ROM, and difficulty performing functional task. She would benefit from continued skilled physical therapy services to address the aforementioned deficits.         PT Long Term Goals - 06/14/17 1113      PT LONG TERM GOAL #1   Title  Patient will have a decrease in L cervical/shoulder pain to 2/10 or less at worst to promote ability to raise her L arm, as well as don and doff shirts and sweaters.     Baseline  6/10 L cervical and shoulder pain at worst (03/20/2017); 5/10 at worst for the past 7 days (05/01/2017); 2.5/10 (06/14/2017)    Time  6    Period  Weeks    Status  On-going    Target Date  07/26/17      PT LONG TERM GOAL #2   Title  Patient will improve L shoulder flexion and abduction AROM to at least 110 degrees or more to promote to promote ability to raise her L arm and perform functional tasks.     Baseline  L shoulder AROM: 85 degrees flexion, 72 degrees abduction (03/20/2017); 104 degrees flexion and scaption AROM with shoulder shrug compensation (05/01/2017);  90 degrees flexion, 86 degrees scaption AROM at start of session (06/14/2017)    Time  6    Period  Weeks    Status  Partially Met    Target Date  07/26/17      PT LONG TERM GOAL #3   Title  Patient will improve her  Quick Dash score by at least 15% as a demonstration of improved function.     Baseline  34% (03/20/2017); 22.72% (05/01/2017); 22.72% (06/14/2017)    Time  6    Period  Weeks    Status  Partially Met    Target Date  07/26/17      PT LONG TERM GOAL #4    Title  Patient will improve her Neck Disability Index Score by at least 12% as a demonstration of impoved function.     Baseline  22% (03/20/2017); 18% (05/01/2017); 6% (06/14/2017)    Time  6    Period  Weeks    Status  Achieved            Plan - 06/14/17 1108    Clinical Impression Statement  Patient demonstrates improved L shoulder AROM following manual therapy to promote posterior and inferior joint mobility with ROM exercises afterwards. Stiff end feel with ROM. Pt also demonstrates overall decreased neck pain  improved function since initial evaluation. Neck pain seems to improve with scapular strengthening and working on improving shoulder ROM, and ability to raise her arm up against gravity while decreasing shoulder shrug compensation. Pt still demonstrates neck pain, decreased mobility at her glenohumeral joint capsule, limited R shoulder ROM, and difficulty performing functional task. She would benefit from continued skilled physical therapy services to address the aforementioned deficits.     History and Personal Factors relevant to plan of care:  No neck or shoulder pain today    Clinical Presentation  Stable    Clinical Presentation due to:  Improved L shoulder ROM after manual therapy, decreasing neck pain    Clinical Decision Making  Low    Rehab Potential  Good    Clinical Impairments Affecting Rehab Potential  Chronicity of condition, hx of CA    PT Frequency  2x / week    PT Duration  6 weeks    PT Treatment/Interventions  Therapeutic exercise;Therapeutic activities;Manual techniques;Patient/family education;Neuromuscular re-education;Ultrasound    PT Next Visit Plan  scapular strengthening, posterior L shoulder capsule stretch, L shoulder ROM, rotator cuff strengthening    Consulted and Agree with Plan of Care  Patient       Patient will benefit from skilled therapeutic intervention in order to improve the following deficits and impairments:  Pain, Postural  dysfunction, Improper body mechanics, Decreased strength, Decreased range of motion  Visit Diagnosis: Cervicalgia - Plan: PT plan of care cert/re-cert  Chronic left shoulder pain - Plan: PT plan of care cert/re-cert     Problem List Patient Active Problem List   Diagnosis Date Noted  . Hypertension 11/22/2016  . History of bilateral breast cancer 07/10/2009    Joneen Boers PT, DPT   06/14/2017, 2:33 PM  [facility] [address], [place], [address] Phone: [phone]   Fax:  [phone]  Name: Tanya Olson MRN: [id number] Date of Birth: June 09, 1956

## 2017-06-14 NOTE — Patient Instructions (Signed)
Wall slides  Slide your hand up the wall with your hand in a pillow case   Keep your shoulder from shrugging and your back from tilting.    Hold for 5 seconds.    Repeat 10 times,    Perform 3 sets daily     Forward and diagonally.

## 2017-06-19 ENCOUNTER — Ambulatory Visit: Payer: BLUE CROSS/BLUE SHIELD

## 2017-06-19 DIAGNOSIS — G8929 Other chronic pain: Secondary | ICD-10-CM

## 2017-06-19 DIAGNOSIS — M25512 Pain in left shoulder: Secondary | ICD-10-CM

## 2017-06-19 DIAGNOSIS — M542 Cervicalgia: Secondary | ICD-10-CM | POA: Diagnosis not present

## 2017-06-19 NOTE — Therapy (Signed)
[facility] [address], [place], [address] Phone: [phone]   Fax:  [phone]  Physical Therapy Treatment  Patient Details  Name: Tanya Olson MRN: [id number] Date of Birth: [birth year] Referring Provider: Daryll Drown, PA-C   Encounter Date: 06/19/2017  PT End of Session - 06/19/17 1050    Visit Number  18    Number of Visits  37    Date for PT Re-Evaluation  07/26/17    PT Start Time  1050    PT Stop Time  1131    PT Time Calculation (min)  41 min    Activity Tolerance  Patient tolerated treatment well    Behavior During Therapy  [facility] for tasks assessed/performed       Past Medical History:  Diagnosis Date  . Breast cancer ([place]) 2011   Bilateral. S/P bilateral mastectomy with reconstruction  . Cancer ([place])   . Hypertension     Past Surgical History:  Procedure Laterality Date  . CHOLECYSTECTOMY, LAPAROSCOPIC      There were no vitals filed for this visit.  Subjective Assessment - 06/19/17 1051    Subjective  Neck and shoulder are pretty good. Back is a little sore from shoveling snow yesterday. No neck or shoulder pain currently.     Pertinent History  Neck and posterior shoulder pain with L sided weakness. Difficulty raising her L arm. Sudden onset around mid May 2018. Got a cervical MRI which revealed bone spurs around C5. One doctor recomended surgery, another doctor recommended PT. Pt wants to try PT first. Denies tingling or numbness.  Pt is R hand dominant. Has not had PT for her neck and L shoulder before. Denies unexplained changes in weight.  Pain hurts more at night than in the morning but pt can sleep. Pt sleeps on her R side currently. Can't sleep on her L side due to pain or feels like she is going to hurt it.  Pt adds seeing an oncologist regularly. Last appointment was on January 26, 2017. Sees her every 6 months. Pt also adds that a mengioma (benign) and thyroid nodules were seen  with the MRI which are also benign and no spread of the CA. No UE paresthesias.     Patient Stated Goals  I would love to be able to have perfect ROM, no pain, be back to how she was before May 2018 (no problems using her L UE), not have surgery.     Currently in Pain?  No/denies    Pain Score  0-No pain    Pain Onset  More than a month ago                              PT Education - 06/19/17 1108    Education provided  Yes    Education Details  ther-ex    [employer]) Educated  Patient    Methods  Explanation;Demonstration;Tactile cues;Verbal cues    Comprehension  Returned demonstration;Verbalized understanding         Objectives  L shoulder AROM: 94 degrees flexion, 91 scaption at start of session.   Manual therapy  Supine posterior, posterior inferior, inferior glide to L shoulder grade 3 to 3+ to promote mobility. Stiff inferior joint capsule paplated    Ther-ex  Supine L shoulder AAROM and caudal pressure to joint with PT  Flexion 10x3  scaption 10x3  Supine L shoulder flexion AROM with 2 lbs    10x 3 second holds flexion for 2 sets. 131 degrees supine flexion ROM    10x 3 seconds diagonal for 2 sets   Standing L shoulder AROM after aforementioned exercises    Flexion 104 degrees, slight shoulder shrug    Scaption: 98 degrees, slight shoulder shrug  Omega machine    Rows plate 5 with 2 lb dumbbell 10x5 seconds.For 3 sets  Bilateral shoulder extension with scapular retraction resisting plate 5 with 2 lb dumbbell 10x with 5 second holds    Then 10x5 seconds with plate 10   Improved exercise technique, movement at target joints, use of target muscles after min to mod verbal, visual, tactile cues.   Improved L shoulder mobility and ROM after manual therapy followed by supine flexion and scaption AROM exercises. Slight shoulder shrug compensation with  standing AROM secondary to limited ROM and joint mobility. Continued with scapular muscle strengthening with cues to decrease shoulder shrugging to help decrease upper trap muscle compensation.      PT Long Term Goals - 06/14/17 1113      PT LONG TERM GOAL #1   Title  Patient will have a decrease in L cervical/shoulder pain to 2/10 or less at worst to promote ability to raise her L arm, as well as don and doff shirts and sweaters.     Baseline  6/10 L cervical and shoulder pain at worst (03/20/2017); 5/10 at worst for the past 7 days (05/01/2017); 2.5/10 (06/14/2017)    Time  6    Period  Weeks    Status  On-going    Target Date  07/26/17      PT LONG TERM GOAL #2   Title  Patient will improve L shoulder flexion and abduction AROM to at least 110 degrees or more to promote to promote ability to raise her L arm and perform functional tasks.     Baseline  L shoulder AROM: 85 degrees flexion, 72 degrees abduction (03/20/2017); 104 degrees flexion and scaption AROM with shoulder shrug compensation (05/01/2017);  90 degrees flexion, 86 degrees scaption AROM at start of session (06/14/2017)    Time  6    Period  Weeks    Status  Partially Met    Target Date  07/26/17      PT LONG TERM GOAL #3   Title  Patient will improve her Quick Dash score by at least 15% as a demonstration of improved function.     Baseline  34% (03/20/2017); 22.72% (05/01/2017); 22.72% (06/14/2017)    Time  6    Period  Weeks    Status  Partially Met    Target Date  07/26/17      PT LONG TERM GOAL #4   Title  Patient will improve her Neck Disability Index Score by at least 12% as a demonstration of impoved function.     Baseline  22% (03/20/2017); 18% (05/01/2017); 6% (06/14/2017)    Time  6    Period  Weeks    Status  Achieved            Plan - 06/19/17 1109    Clinical Impression Statement  Improved L shoulder mobility and ROM after manual therapy followed by supine flexion and scaption AROM exercises. Slight  shoulder shrug compensation with standing AROM secondary to limited ROM and joint mobility. Continued with scapular muscle strengthening with cues to decrease shoulder shrugging to help decrease  upper trap muscle compensation.     History and Personal Factors relevant to plan of care:  Chronicity of condition, difficulty raising her L arm, donning and doffing shirts and sweaters     Clinical Presentation  Stable    Clinical Presentation due to:  no neck or shoulder pain     Clinical Decision Making  Low    Rehab Potential  Good    Clinical Impairments Affecting Rehab Potential  Chronicity of condition, hx of CA    PT Frequency  2x / week    PT Duration  6 weeks    PT Treatment/Interventions  Therapeutic exercise;Therapeutic activities;Manual techniques;Patient/family education;Neuromuscular re-education;Ultrasound    PT Next Visit Plan  scapular strengthening, posterior L shoulder capsule stretch, L shoulder ROM, rotator cuff strengthening    Consulted and Agree with Plan of Care  Patient       Patient will benefit from skilled therapeutic intervention in order to improve the following deficits and impairments:  Pain, Postural dysfunction, Improper body mechanics, Decreased strength, Decreased range of motion  Visit Diagnosis: Cervicalgia  Chronic left shoulder pain     Problem List Patient Active Problem List   Diagnosis Date Noted  . Hypertension 11/22/2016  . History of bilateral breast cancer 07/10/2009    Miguel Laygo PT, DPT   06/19/2017, 11:40 AM  [facility] [address] [place], [place], [address] Phone: [phone]   Fax:  [phone]  Name: Tanya Olson MRN: [id number] Date of Birth: 12/22/1955   

## 2017-06-21 ENCOUNTER — Ambulatory Visit: Payer: BLUE CROSS/BLUE SHIELD

## 2017-06-26 ENCOUNTER — Ambulatory Visit: Payer: BLUE CROSS/BLUE SHIELD

## 2017-06-26 DIAGNOSIS — M25512 Pain in left shoulder: Secondary | ICD-10-CM | POA: Diagnosis not present

## 2017-06-26 DIAGNOSIS — M542 Cervicalgia: Secondary | ICD-10-CM | POA: Diagnosis not present

## 2017-06-26 DIAGNOSIS — G8929 Other chronic pain: Secondary | ICD-10-CM | POA: Diagnosis not present

## 2017-06-26 NOTE — Patient Instructions (Signed)
  Supine shoulder ER stretch, hands behind head 10x5 seconds for 3 sets daily reviewed and given as part of her HEP. Pt demonstrated and verbalized understanding.

## 2017-06-26 NOTE — Therapy (Signed)
[place] [facility] [address] [place], [address] Phone: [phone]   Fax:  [phone]  Physical Therapy Treatment  Patient Details  Name: Tanya Olson MRN: [id number] Date of Birth: 28-Nov-1955 Referring Provider: Daryll Drown, PA-C   Encounter Date: 06/26/2017  PT End of Session - 06/26/17 1033    Visit Number  19    Number of Visits  37    Date for PT Re-Evaluation  07/26/17    PT Start Time  1033    PT Stop Time  1113    PT Time Calculation (min)  40 min    Activity Tolerance  Patient tolerated treatment well    Behavior During Therapy  [facility] for tasks assessed/performed       Past Medical History:  Diagnosis Date  . Breast cancer ([place]) 2011   Bilateral. S/P bilateral mastectomy with reconstruction  . Cancer ([place])   . Hypertension     Past Surgical History:  Procedure Laterality Date  . CHOLECYSTECTOMY, LAPAROSCOPIC      There were no vitals filed for this visit.  Subjective Assessment - 06/26/17 1034    Subjective  Neck and shoulder are a tiny bit sore. Maybe a 1/10 currently. Was able to lift her suitcase to the overhead bin when flying.  Donning and doffing shirts and sweaters is better.     Pertinent History  Neck and posterior shoulder pain with L sided weakness. Difficulty raising her L arm. Sudden onset around mid May 2018. Got a cervical MRI which revealed bone spurs around C5. One doctor recomended surgery, another doctor recommended PT. Pt wants to try PT first. Denies tingling or numbness.  Pt is R hand dominant. Has not had PT for her neck and L shoulder before. Denies unexplained changes in weight.  Pain hurts more at night than in the morning but pt can sleep. Pt sleeps on her R side currently. Can't sleep on her L side due to pain or feels like she is going to hurt it.  Pt adds seeing an oncologist regularly. Last appointment was on January 26, 2017. Sees her every 6 months. Pt also adds  that a mengioma (benign) and thyroid nodules were seen with the MRI which are also benign and no spread of the CA. No UE paresthesias.     Patient Stated Goals  I would love to be able to have perfect ROM, no pain, be back to how she was before May 2018 (no problems using her L UE), not have surgery.     Currently in Pain?  Yes    Pain Score  1     Pain Onset  More than a month ago                              PT Education - 06/26/17 1049    Education provided  Yes    Education Details  ther-ex, HEP    Person(s) Educated  Patient    Methods  Explanation;Demonstration;Tactile cues;Verbal cues    Comprehension  Verbalized understanding;Returned demonstration         Objectives  L shoulder AROM: 102 degrees flexion, 90scaption at start of session.slight shoulder shrug compensation   Pt states donning and doffing shirts and sweaters is better   Manual therapy   L arm supported in about 80 degrees abduction. Supine posterior, posterior inferior, inferior glide to L shoulder grade 3 to  3+ to promote mobility. Stiff inferior and posterior joint capsule paplated    Ther-ex  Supine L shoulder AAROM and caudal pressure to joint with PT  Flexion 10x3 scaption 10x3  Supine shoulder ER stretch (hands behind head) 10x5 seconds for 2 sets   Supine manually resisted L shoulder     Flexion 10x2     scaption 10x2   L shoulder AROM standing: 106 degrees flexion, 100 degrees scaption, slight shoulder shrug compensation after aforementioned treatment  Omega machine   Rows plate 10 with 2 lb dumbbell 10x5 secondsfor 3 sets. Pt states medium level effort.   Bilateral shoulder extension with scapular retraction resisting plate 10 with 65L9 seconds            Improved exercise technique, movement at target joints, use of target muscles after min to mod verbal, visual, tactile  cues.     Pt demonstrates improved starting L shoulder flexion AROM, improved L shoulder scaption AROM after manual therapy to promote joint mobility. Pt demonstrates limited posterior and inferior joint capsule mobility palpated, manual therapy and exercises performed to help address. Continued with middle and lower trap muscle strengthening to help decrease L upper trap muscle tension. Pt tolerated session well without aggravation of symptoms.      PT Long Term Goals - 06/14/17 1113      PT LONG TERM GOAL #1   Title  Patient will have a decrease in L cervical/shoulder pain to 2/10 or less at worst to promote ability to raise her L arm, as well as don and doff shirts and sweaters.     Baseline  6/10 L cervical and shoulder pain at worst (03/20/2017); 5/10 at worst for the past 7 days (05/01/2017); 2.5/10 (06/14/2017)    Time  6    Period  Weeks    Status  On-going    Target Date  07/26/17      PT LONG TERM GOAL #2   Title  Patient will improve L shoulder flexion and abduction AROM to at least 110 degrees or more to promote to promote ability to raise her L arm and perform functional tasks.     Baseline  L shoulder AROM: 85 degrees flexion, 72 degrees abduction (03/20/2017); 104 degrees flexion and scaption AROM with shoulder shrug compensation (05/01/2017);  90 degrees flexion, 86 degrees scaption AROM at start of session (06/14/2017)    Time  6    Period  Weeks    Status  Partially Met    Target Date  07/26/17      PT LONG TERM GOAL #3   Title  Patient will improve her Quick Dash score by at least 15% as a demonstration of improved function.     Baseline  34% (03/20/2017); 22.72% (05/01/2017); 22.72% (06/14/2017)    Time  6    Period  Weeks    Status  Partially Met    Target Date  07/26/17      PT LONG TERM GOAL #4   Title  Patient will improve her Neck Disability Index Score by at least 12% as a demonstration of impoved function.     Baseline  22% (03/20/2017); 18% (05/01/2017); 6%  (06/14/2017)    Time  6    Period  Weeks    Status  Achieved            Plan - 06/26/17 1050    Clinical Impression Statement  Pt demonstrates improved starting L shoulder flexion AROM, improved L shoulder scaption  AROM after manual therapy to promote joint mobility. Pt demonstrates limited posterior and inferior joint capsule mobility palpated, manual therapy and exercises performed to help address. Continued with middle and lower trap muscle strengthening to help decrease L upper trap muscle tension. Pt tolerated session well without aggravation of symptoms.      History and Personal Factors relevant to plan of care:  Chronicity of condition, difficulty raising her L arm, donning and doffing shirts and sweaters      Clinical Presentation  Stable    Clinical Presentation due to:  improved starting L shoulder flexion AROM    Clinical Decision Making  Low    Rehab Potential  Good    Clinical Impairments Affecting Rehab Potential  Chronicity of condition, hx of CA    PT Frequency  2x / week    PT Duration  6 weeks    PT Treatment/Interventions  Therapeutic exercise;Therapeutic activities;Manual techniques;Patient/family education;Neuromuscular re-education;Ultrasound    PT Next Visit Plan  scapular strengthening, posterior L shoulder capsule stretch, L shoulder ROM, rotator cuff strengthening    Consulted and Agree with Plan of Care  Patient       Patient will benefit from skilled therapeutic intervention in order to improve the following deficits and impairments:  Pain, Postural dysfunction, Improper body mechanics, Decreased strength, Decreased range of motion  Visit Diagnosis: Cervicalgia  Chronic left shoulder pain     Problem List Patient Active Problem List   Diagnosis Date Noted  . Hypertension 11/22/2016  . History of bilateral breast cancer 07/10/2009    Joneen Boers PT, DPT   06/26/2017, 11:26 AM  [place] [facility] [address], [place], [address] Phone: [phone]   Fax:  [phone]  Name: Tanya Olson MRN: [id number] Date of Birth: 22-Feb-1956

## 2017-06-28 ENCOUNTER — Ambulatory Visit: Payer: BLUE CROSS/BLUE SHIELD

## 2017-06-28 DIAGNOSIS — M542 Cervicalgia: Secondary | ICD-10-CM

## 2017-06-28 DIAGNOSIS — M25512 Pain in left shoulder: Secondary | ICD-10-CM | POA: Diagnosis not present

## 2017-06-28 DIAGNOSIS — G8929 Other chronic pain: Secondary | ICD-10-CM | POA: Diagnosis not present

## 2017-06-28 NOTE — Therapy (Signed)
[place] [facility] [address] [place], [address] Phone: [phone]   Fax:  [phone]  Physical Therapy Treatment  Patient Details  Name: Tanya Olson MRN: [id number] Date of Birth: [birth year] Referring Provider: Daryll Drown, PA-C   Encounter Date: 06/28/2017  PT End of Session - 06/28/17 1036    Visit Number  20    Number of Visits  37    Date for PT Re-Evaluation  07/26/17    PT Start Time  1036    PT Stop Time  1118    PT Time Calculation (min)  42 min    Activity Tolerance  Patient tolerated treatment well    Behavior During Therapy  [facility] for tasks assessed/performed       Past Medical History:  Diagnosis Date  . Breast cancer ([place]) 2011   Bilateral. S/P bilateral mastectomy with reconstruction  . Cancer ([place])   . Hypertension     Past Surgical History:  Procedure Laterality Date  . CHOLECYSTECTOMY, LAPAROSCOPIC      There were no vitals filed for this visit.  Subjective Assessment - 06/28/17 1038    Subjective  Neck and shoulder feels fine today. Was a little sore today. No pain currently.     Pertinent History  Neck and posterior shoulder pain with L sided weakness. Difficulty raising her L arm. Sudden onset around mid May 2018. Got a cervical MRI which revealed bone spurs around C5. One doctor recomended surgery, another doctor recommended PT. Pt wants to try PT first. Denies tingling or numbness.  Pt is R hand dominant. Has not had PT for her neck and L shoulder before. Denies unexplained changes in weight.  Pain hurts more at night than in the morning but pt can sleep. Pt sleeps on her R side currently. Can't sleep on her L side due to pain or feels like she is going to hurt it.  Pt adds seeing an oncologist regularly. Last appointment was on January 26, 2017. Sees her every 6 months. Pt also adds that a mengioma (benign) and thyroid nodules were seen with the MRI which are also benign and no  spread of the CA. No UE paresthesias.     Patient Stated Goals  I would love to be able to have perfect ROM, no pain, be back to how she was before May 2018 (no problems using her L UE), not have surgery.     Currently in Pain?  No/denies    Pain Score  1     Pain Onset  More than a month ago                              PT Education - 06/28/17 1052    Education provided  Yes    Education Details  ther-ex, HEP    Person(s) Educated  Patient    Methods  Explanation;Demonstration;Tactile cues;Verbal cues    Comprehension  Returned demonstration;Verbalized understanding         Objectives  L shoulder AROM: 104degrees flexion, 103scaptionat start of session.slight shoulder shrug compensation at start of session.      Manual therapy  L arm supported in about 80 degrees abduction. Supine posterior, posterior inferior, inferior, glide to L shoulder grade 3 to 3+ to promote mobility.  L arm supported in scaption about 95 degrees, STM L distal pectoralis muscle       Ther-ex  Supine  L shoulder AAROM and caudal pressure to joint by PT  Flexion 10x3 scaption 10x3   L shoulder AROM: 111 degrees flexion, 108 degrees L shoulder scaption AROM after aforementioned treatment.   Supine shoulder ER stretch (hands behind head) 10x5 seconds for 2 sets   Supine manually resisted L shoulder                          Flexion 10x2                          scaption 10x2  Supine open books 10x5 seconds for 3 sets to promote pectoralis muscle stretch.     Reviewed and given as part of her HEP. Pt demonstrated and verbalized understanding. Handout provided.   standing rows resisting blue band 10x3 with 5 second holds     Improved exercise technique, movement at target joints, use of target muscles after min to mod verbal, visual, tactile cues.    Improving L shoulder flexion and scaption AROM  after manual therapy to promote joint mobility and decreasing distal pectoralis major muscle tension, followed by ROM exercises. Pt making good progress with being able to raise her L arm up against gravity.               PT Long Term Goals - 06/14/17 1113      PT LONG TERM GOAL #1   Title  Patient will have a decrease in L cervical/shoulder pain to 2/10 or less at worst to promote ability to raise her L arm, as well as don and doff shirts and sweaters.     Baseline  6/10 L cervical and shoulder pain at worst (03/20/2017); 5/10 at worst for the past 7 days (05/01/2017); 2.5/10 (06/14/2017)    Time  6    Period  Weeks    Status  On-going    Target Date  07/26/17      PT LONG TERM GOAL #2   Title  Patient will improve L shoulder flexion and abduction AROM to at least 110 degrees or more to promote to promote ability to raise her L arm and perform functional tasks.     Baseline  L shoulder AROM: 85 degrees flexion, 72 degrees abduction (03/20/2017); 104 degrees flexion and scaption AROM with shoulder shrug compensation (05/01/2017);  90 degrees flexion, 86 degrees scaption AROM at start of session (06/14/2017)    Time  6    Period  Weeks    Status  Partially Met    Target Date  07/26/17      PT LONG TERM GOAL #3   Title  Patient will improve her Quick Dash score by at least 15% as a demonstration of improved function.     Baseline  34% (03/20/2017); 22.72% (05/01/2017); 22.72% (06/14/2017)    Time  6    Period  Weeks    Status  Partially Met    Target Date  07/26/17      PT LONG TERM GOAL #4   Title  Patient will improve her Neck Disability Index Score by at least 12% as a demonstration of impoved function.     Baseline  22% (03/20/2017); 18% (05/01/2017); 6% (06/14/2017)    Time  6    Period  Weeks    Status  Achieved            Plan - 06/28/17 1054    Clinical Impression Statement  Improving L shoulder flexion and scaption AROM after manual therapy to promote joint  mobility and decreasing distal pectoralis major muscle tension, followed by ROM exercises. Pt making good progress with being able to raise her L arm up against gravity.     History and Personal Factors relevant to plan of care:  Chronicity of condition, difficulty raising her L arm, donning and doffing shirts and sweaters      Clinical Presentation  Stable    Clinical Presentation due to:  Improving L shoulder AROM    Clinical Decision Making  Low    Rehab Potential  Good    Clinical Impairments Affecting Rehab Potential  Chronicity of condition, hx of CA    PT Frequency  2x / week    PT Duration  6 weeks    PT Treatment/Interventions  Therapeutic exercise;Therapeutic activities;Manual techniques;Patient/family education;Neuromuscular re-education;Ultrasound    PT Next Visit Plan  scapular strengthening, posterior L shoulder capsule stretch, L shoulder ROM, rotator cuff strengthening    Consulted and Agree with Plan of Care  Patient       Patient will benefit from skilled therapeutic intervention in order to improve the following deficits and impairments:  Pain, Postural dysfunction, Improper body mechanics, Decreased strength, Decreased range of motion  Visit Diagnosis: Cervicalgia  Chronic left shoulder pain     Problem List Patient Active Problem List   Diagnosis Date Noted  . Hypertension 11/22/2016  . History of bilateral breast cancer 07/10/2009    Joneen Boers PT, DPT   06/28/2017, 1:29 PM  [place] [facility] [address], [place], [address] Phone: [phone]   Fax:  [phone]  Name: Tanya Olson MRN: [id number] Date of Birth: 08/13/1955

## 2017-06-28 NOTE — Patient Instructions (Signed)
   Lying on your back, knees bent:   Open your arms wide to feel your shoulder blade muscles squeeze behind you and to feel a stretch at your chest muscle area.    Hold for 5 seconds.    Repeat 10 times   Perform 3 sets daily.

## 2017-07-12 ENCOUNTER — Ambulatory Visit: Payer: BLUE CROSS/BLUE SHIELD | Attending: Medical

## 2017-07-12 DIAGNOSIS — G8929 Other chronic pain: Secondary | ICD-10-CM | POA: Diagnosis not present

## 2017-07-12 DIAGNOSIS — M542 Cervicalgia: Secondary | ICD-10-CM | POA: Insufficient documentation

## 2017-07-12 DIAGNOSIS — M25512 Pain in left shoulder: Secondary | ICD-10-CM | POA: Insufficient documentation

## 2017-07-12 NOTE — Patient Instructions (Signed)
Gave supine open books resisting yellow band 10x3 with 5 second holds as part of her HEP. Pt demonstrated and verbalized understanding.

## 2017-07-12 NOTE — Therapy (Signed)
[place] [facility] [address], [place], [address] Phone: [phone]   Fax:  [phone]  Physical Therapy Treatment  Patient Details  Name: Tanya Olson MRN: [id number] Date of Birth: Nov 08, 1955 Referring Provider: Daryll Drown, PA-C   Encounter Date: 07/12/2017  PT End of Session - 07/12/17 1033    Visit Number  21    Number of Visits  37    Date for PT Re-Evaluation  07/26/17    PT Start Time  1034    PT Stop Time  1116    PT Time Calculation (min)  42 min    Activity Tolerance  Patient tolerated treatment well    Behavior During Therapy  [facility] for tasks assessed/performed       Past Medical History:  Diagnosis Date  . Breast cancer ([place]) 2011   Bilateral. S/P bilateral mastectomy with reconstruction  . Cancer ([place])   . Hypertension     Past Surgical History:  Procedure Laterality Date  . CHOLECYSTECTOMY, LAPAROSCOPIC      There were no vitals filed for this visit.  Subjective Assessment - 07/12/17 1034    Subjective  L shoulder (upper trap is a little sore). Has not been able to do her HEP during the holidays.     Pertinent History  Neck and posterior shoulder pain with L sided weakness. Difficulty raising her L arm. Sudden onset around mid May 2018. Got a cervical MRI which revealed bone spurs around C5. One doctor recomended surgery, another doctor recommended PT. Pt wants to try PT first. Denies tingling or numbness.  Pt is R hand dominant. Has not had PT for her neck and L shoulder before. Denies unexplained changes in weight.  Pain hurts more at night than in the morning but pt can sleep. Pt sleeps on her R side currently. Can't sleep on her L side due to pain or feels like she is going to hurt it.  Pt adds seeing an oncologist regularly. Last appointment was on January 26, 2017. Sees her every 6 months. Pt also adds that a mengioma (benign) and thyroid nodules were seen with the MRI which are also  benign and no spread of the CA. No UE paresthesias.     Patient Stated Goals  I would love to be able to have perfect ROM, no pain, be back to how she was before May 2018 (no problems using her L UE), not have surgery.     Currently in Pain?  Yes    Pain Score  2  L upper trap    Pain Onset  More than a month ago                              PT Education - 07/12/17 1035    Education provided  Yes    Education Details  ther-ex    [employer]) Educated  Patient    Methods  Explanation;Demonstration;Tactile cues;Verbal cues    Comprehension  Returned demonstration;Verbalized understanding         Objectives  L shoulder AROM:110degrees flexion, 113scaptionat start of session.slight shoulder shrug compensation at start of session.      Manual therapy  L arm supported in about 80 degrees abduction.Supine posterior, posterior inferior, inferior, glide to L shoulder grade 3 to 3+ to promote mobility.  L arm supported in scaption in supine about 95 degrees, STM L distal pectoralis muscle  Ther-ex   Supine open books 10x5 seconds then with yellow band 10x5 seconds for 2 sets to promote pectoralis muscle stretch.   Reviewed and given as part of her HEP with yellow band 10x3 with 5 second holds daily   Supine L shoulder AAROM and caudal pressure to joint by PT  Flexion 10x3 scaption 10x3  Supine manually resisted L shoulder  Flexion 10x2  scaption 10x2  L shoulder AROM: 116 degrees flexion, 113 degrees L shoulder scaption AROM after aforementioned treatment.   Omega machine   Rows plate 10 with 2 lb dumbbell 10x5 secondsfor 3sets. Pt states medium level effort.   Bilateral shoulder extension with scapular retraction resisting plate 10 with 27O3 seconds for 2 sets  Supine shoulder ER stretch  (hands behind head) 10x5 seconds for 2 sets     Improved exercise technique, movement at target joints, use of target muscles after min to mod verbal, visual, tactile cues.    Improved starting L shoulder flexion and scaption AROM compared to previous session. Continued working on decreasing L shoulder joint capsule stiffness and improve pectoralis flexibility and scapular strengthening to promote ability to raise L arm up and decrease L upper trap/neck soreness.               PT Long Term Goals - 06/14/17 1113      PT LONG TERM GOAL #1   Title  Patient will have a decrease in L cervical/shoulder pain to 2/10 or less at worst to promote ability to raise her L arm, as well as don and doff shirts and sweaters.     Baseline  6/10 L cervical and shoulder pain at worst (03/20/2017); 5/10 at worst for the past 7 days (05/01/2017); 2.5/10 (06/14/2017)    Time  6    Period  Weeks    Status  On-going    Target Date  07/26/17      PT LONG TERM GOAL #2   Title  Patient will improve L shoulder flexion and abduction AROM to at least 110 degrees or more to promote to promote ability to raise her L arm and perform functional tasks.     Baseline  L shoulder AROM: 85 degrees flexion, 72 degrees abduction (03/20/2017); 104 degrees flexion and scaption AROM with shoulder shrug compensation (05/01/2017);  90 degrees flexion, 86 degrees scaption AROM at start of session (06/14/2017)    Time  6    Period  Weeks    Status  Partially Met    Target Date  07/26/17      PT LONG TERM GOAL #3   Title  Patient will improve her Quick Dash score by at least 15% as a demonstration of improved function.     Baseline  34% (03/20/2017); 22.72% (05/01/2017); 22.72% (06/14/2017)    Time  6    Period  Weeks    Status  Partially Met    Target Date  07/26/17      PT LONG TERM GOAL #4   Title  Patient will improve her Neck Disability Index Score by at least 12% as a demonstration of impoved function.     Baseline   22% (03/20/2017); 18% (05/01/2017); 6% (06/14/2017)    Time  6    Period  Weeks    Status  Achieved            Plan - 07/12/17 1032    Clinical Impression Statement  Improved starting L shoulder flexion and scaption AROM compared to previous  session. Continued working on decreasing L shoulder joint capsule stiffness and improve pectoralis flexibility and scapular strengthening to promote ability to raise L arm up and decrease L upper trap/neck soreness.     History and Personal Factors relevant to plan of care:   Chronicity of condition, difficulty raising her L arm, donning and doffing shirts and sweaters       Clinical Presentation  Stable    Clinical Presentation due to:  Improving L shoulder AROM    Clinical Decision Making  Low    Rehab Potential  Good    Clinical Impairments Affecting Rehab Potential  Chronicity of condition, hx of CA    PT Frequency  2x / week    PT Duration  6 weeks    PT Treatment/Interventions  Therapeutic exercise;Therapeutic activities;Manual techniques;Patient/family education;Neuromuscular re-education;Ultrasound    PT Next Visit Plan  scapular strengthening, posterior L shoulder capsule stretch, L shoulder ROM, rotator cuff strengthening    Consulted and Agree with Plan of Care  Patient       Patient will benefit from skilled therapeutic intervention in order to improve the following deficits and impairments:  Pain, Postural dysfunction, Improper body mechanics, Decreased strength, Decreased range of motion  Visit Diagnosis: Cervicalgia  Chronic left shoulder pain     Problem List Patient Active Problem List   Diagnosis Date Noted  . Hypertension 11/22/2016  . History of bilateral breast cancer 07/10/2009    Joneen Boers PT, DPT   07/12/2017, 3:24 PM  [place] [facility] [address] [place], [address] Phone: [phone]   Fax:  [phone]  Name: Tanya Olson MRN:  [id number] Date of Birth: June 23, 1956

## 2017-07-16 ENCOUNTER — Encounter: Payer: Self-pay | Admitting: Physical Therapy

## 2017-07-16 ENCOUNTER — Other Ambulatory Visit: Payer: Self-pay

## 2017-07-16 ENCOUNTER — Ambulatory Visit: Payer: BLUE CROSS/BLUE SHIELD | Admitting: Physical Therapy

## 2017-07-16 DIAGNOSIS — G8929 Other chronic pain: Secondary | ICD-10-CM

## 2017-07-16 DIAGNOSIS — M542 Cervicalgia: Secondary | ICD-10-CM

## 2017-07-16 DIAGNOSIS — M25512 Pain in left shoulder: Secondary | ICD-10-CM | POA: Diagnosis not present

## 2017-07-16 NOTE — Therapy (Signed)
[facility] [address], [place], [address] Phone: [phone]   Fax:  [phone]  Physical Therapy Treatment  Patient Details  Name: Tanya Olson MRN: [id number] Date of Birth: Mar 04, 1956 Referring Provider: Daryll Drown, PA-C   Encounter Date: 07/16/2017  PT End of Session - 07/16/17 0905    Visit Number  22    Number of Visits  37    Date for PT Re-Evaluation  07/26/17    PT Start Time  0905    PT Stop Time  0946    PT Time Calculation (min)  41 min    Activity Tolerance  Patient tolerated treatment well    Behavior During Therapy  [facility] for tasks assessed/performed       Past Medical History:  Diagnosis Date  . Breast cancer ([place]) 2011   Bilateral. S/P bilateral mastectomy with reconstruction  . Cancer ([place])   . Hypertension     Past Surgical History:  Procedure Laterality Date  . CHOLECYSTECTOMY, LAPAROSCOPIC      There were no vitals filed for this visit.  Subjective Assessment - 07/16/17 0908    Subjective  Pt reports her pain has improved since starting therapy.  No new complaints or concerns.  Pt has been completing HEP ~3-4 days/wk.  Pt reports continued difficulty reaching overhead.      Pertinent History  Neck and posterior shoulder pain with L sided weakness. Difficulty raising her L arm. Sudden onset around mid May 2018. Got a cervical MRI which revealed bone spurs around C5. One doctor recomended surgery, another doctor recommended PT. Pt wants to try PT first. Denies tingling or numbness.  Pt is R hand dominant. Has not had PT for her neck and L shoulder before. Denies unexplained changes in weight.  Pain hurts more at night than in the morning but pt can sleep. Pt sleeps on her R side currently. Can't sleep on her L side due to pain or feels like she is going to hurt it.  Pt adds seeing an oncologist regularly. Last appointment was on January 26, 2017. Sees her every 6 months. Pt also  adds that a mengioma (benign) and thyroid nodules were seen with the MRI which are also benign and no spread of the CA. No UE paresthesias.     Patient Stated Goals  I would love to be able to have perfect ROM, no pain, be back to how she was before May 2018 (no problems using her L UE), not have surgery.     Currently in Pain?  No/denies    Pain Onset  More than a month ago       TREATMENT   Manual Therapy:  L arm supported in about 80 degrees abduction.?Supine posterior, inferior, glide to L shoulder grade 3-4 to promote mobility. 4x45 second bouts each direction.  MWM: Standing L shoulder F wall slides with towel with manual inferior glide using towel from this PT. x20    Therapeutic Exercise:  Supine open book Bil pec stretch with towel roll under thoracic spine x2 minutes  Supine BUE wand into F with 10 second holds at end range x10  Supine manually resisted L shoulder: Flexion x10  L shoulder AROM: 132 degrees flexion  Rolling theraball up overhead along wall with 10 second holds in standing. x10  Omega machine rows plate 10lbs 44B2 seconds for 1 set. With 15 lbs 2x10. Pt states medium level effort.  PT Education - 07/16/17 0905    Education provided  Yes    Education Details  Exercise technique    Person(s) Educated  Patient    Methods  Explanation;Demonstration;Verbal cues    Comprehension  Verbalized understanding;Returned demonstration;Verbal cues required;Need further instruction          PT Long Term Goals - 06/14/17 1113      PT LONG TERM GOAL #1   Title  Patient will have a decrease in L cervical/shoulder pain to 2/10 or less at worst to promote ability to raise her L arm, as well as don and doff shirts and sweaters.     Baseline  6/10 L cervical and shoulder pain at worst (03/20/2017); 5/10 at worst for the past 7 days (05/01/2017); 2.5/10 (06/14/2017)    Time  6    Period  Weeks    Status  On-going    Target Date  07/26/17      PT LONG TERM GOAL #2    Title  Patient will improve L shoulder flexion and abduction AROM to at least 110 degrees or more to promote to promote ability to raise her L arm and perform functional tasks.     Baseline  L shoulder AROM: 85 degrees flexion, 72 degrees abduction (03/20/2017); 104 degrees flexion and scaption AROM with shoulder shrug compensation (05/01/2017);  90 degrees flexion, 86 degrees scaption AROM at start of session (06/14/2017)    Time  6    Period  Weeks    Status  Partially Met    Target Date  07/26/17      PT LONG TERM GOAL #3   Title  Patient will improve her Quick Dash score by at least 15% as a demonstration of improved function.     Baseline  34% (03/20/2017); 22.72% (05/01/2017); 22.72% (06/14/2017)    Time  6    Period  Weeks    Status  Partially Met    Target Date  07/26/17      PT LONG TERM GOAL #4   Title  Patient will improve her Neck Disability Index Score by at least 12% as a demonstration of impoved function.     Baseline  22% (03/20/2017); 18% (05/01/2017); 6% (06/14/2017)    Time  6    Period  Weeks    Status  Achieved            Plan - 07/16/17 6294    Clinical Impression Statement  Improved L shoulder F AROM in standing following manual intervention and therapeutic exercise.  Focused intervention on promoting overhead reaching activity, introducing MWM into flexion this session.  Pt instructed to bring in HEP handouts to be consolidated next session.  Pt will continue to benefit from PT for improved ROM, strength, and functional use of LUE.      Rehab Potential  Good    Clinical Impairments Affecting Rehab Potential  Chronicity of condition, hx of CA    PT Frequency  2x / week    PT Duration  6 weeks    PT Treatment/Interventions  Therapeutic exercise;Therapeutic activities;Manual techniques;Patient/family education;Neuromuscular re-education;Ultrasound    PT Next Visit Plan  scapular strengthening, posterior L shoulder capsule stretch, L shoulder ROM, rotator cuff  strengthening    Consulted and Agree with Plan of Care  Patient       Patient will benefit from skilled therapeutic intervention in order to improve the following deficits and impairments:  Pain, Postural dysfunction, Improper body mechanics, Decreased strength, Decreased range  of motion  Visit Diagnosis: Cervicalgia  Chronic left shoulder pain     Problem List Patient Active Problem List   Diagnosis Date Noted  . Hypertension 11/22/2016  . History of bilateral breast cancer 07/10/2009    Collie Siad PT, DPT 07/16/2017, 9:47 AM  [facility] [address], [place], [address] Phone: [phone]   Fax:  [phone]  Name: Tanya Olson MRN: [id number] Date of Birth: 1956/04/02

## 2017-07-19 ENCOUNTER — Ambulatory Visit: Payer: BLUE CROSS/BLUE SHIELD | Admitting: Physical Therapy

## 2017-07-19 ENCOUNTER — Other Ambulatory Visit: Payer: Self-pay

## 2017-07-19 ENCOUNTER — Encounter: Payer: Self-pay | Admitting: Physical Therapy

## 2017-07-19 DIAGNOSIS — G8929 Other chronic pain: Secondary | ICD-10-CM

## 2017-07-19 DIAGNOSIS — M542 Cervicalgia: Secondary | ICD-10-CM | POA: Diagnosis not present

## 2017-07-19 DIAGNOSIS — M25512 Pain in left shoulder: Secondary | ICD-10-CM | POA: Diagnosis not present

## 2017-07-19 NOTE — Therapy (Signed)
[facility] [address] [place], [address] Phone: [phone]   Fax:  [phone]  Physical Therapy Treatment  Patient Details  Name: Tanya Olson MRN: [id number] Date of Birth: 02/29/1956 Referring Provider: Daryll Drown, PA-C   Encounter Date: 07/19/2017  PT End of Session - 07/19/17 1040    Visit Number  23    Number of Visits  37    Date for PT Re-Evaluation  07/26/17    PT Start Time  1037    PT Stop Time  1110    PT Time Calculation (min)  33 min    Activity Tolerance  Patient tolerated treatment well    Behavior During Therapy  [facility] for tasks assessed/performed       Past Medical History:  Diagnosis Date  . Breast cancer ([place]) 2011   Bilateral. S/P bilateral mastectomy with reconstruction  . Cancer ([place])   . Hypertension     Past Surgical History:  Procedure Laterality Date  . CHOLECYSTECTOMY, LAPAROSCOPIC      There were no vitals filed for this visit.  Subjective Assessment - 07/19/17 1040    Subjective  Pt reports she was sore after last session in a good way.  No new complaints or concerns.      Pertinent History  Neck and posterior shoulder pain with L sided weakness. Difficulty raising her L arm. Sudden onset around mid May 2018. Got a cervical MRI which revealed bone spurs around C5. One doctor recomended surgery, another doctor recommended PT. Pt wants to try PT first. Denies tingling or numbness.  Pt is R hand dominant. Has not had PT for her neck and L shoulder before. Denies unexplained changes in weight.  Pain hurts more at night than in the morning but pt can sleep. Pt sleeps on her R side currently. Can't sleep on her L side due to pain or feels like she is going to hurt it.  Pt adds seeing an oncologist regularly. Last appointment was on January 26, 2017. Sees her every 6 months. Pt also adds that a mengioma (benign) and thyroid nodules were seen with the MRI which are also  benign and no spread of the CA. No UE paresthesias.     Patient Stated Goals  I would love to be able to have perfect ROM, no pain, be back to how she was before May 2018 (no problems using her L UE), not have surgery.     Currently in Pain?  No/denies    Pain Onset  More than a month ago        TREATMENT   Manual Therapy:  L arm supported in about 80 degrees abduction. Supine posterior, inferior, glide to L shoulder grade 3-4 to promote mobility. 4x45 second bouts each direction.  MWM: Standing L shoulder F wall slides with towel with manual inferior glide using towel from this PT. x20   Therapeutic Exercise:  Supine open book Bil pec stretch with towel roll under thoracic spine x2 minutes  Supine BUE wand into F with 10 second holds at end range x10  Supine manually resisted L shoulder: Flexion x10  L shoulder AROM: 136 degrees flexion  Rolling theraball up overhead along wall with 10 second holds in standing. x10  Omega machine rows plate 15 lbs 7F41 with 5 second holds. Pt states medium level effort.  PT Education - 07/19/17 1041    Education provided  Yes    Education Details  Exercise technique    Person(s) Educated  Patient    Methods  Explanation;Demonstration;Verbal cues    Comprehension  Verbalized understanding;Returned demonstration;Verbal cues required;Need further instruction          PT Long Term Goals - 06/14/17 1113      PT LONG TERM GOAL #1   Title  Patient will have a decrease in L cervical/shoulder pain to 2/10 or less at worst to promote ability to raise her L arm, as well as don and doff shirts and sweaters.     Baseline  6/10 L cervical and shoulder pain at worst (03/20/2017); 5/10 at worst for the past 7 days (05/01/2017); 2.5/10 (06/14/2017)    Time  6    Period  Weeks    Status  On-going    Target Date  07/26/17      PT LONG TERM GOAL #2   Title  Patient will improve L shoulder flexion and abduction AROM  to at least 110 degrees or more to promote to promote ability to raise her L arm and perform functional tasks.     Baseline  L shoulder AROM: 85 degrees flexion, 72 degrees abduction (03/20/2017); 104 degrees flexion and scaption AROM with shoulder shrug compensation (05/01/2017);  90 degrees flexion, 86 degrees scaption AROM at start of session (06/14/2017)    Time  6    Period  Weeks    Status  Partially Met    Target Date  07/26/17      PT LONG TERM GOAL #3   Title  Patient will improve her Quick Dash score by at least 15% as a demonstration of improved function.     Baseline  34% (03/20/2017); 22.72% (05/01/2017); 22.72% (06/14/2017)    Time  6    Period  Weeks    Status  Partially Met    Target Date  07/26/17      PT LONG TERM GOAL #4   Title  Patient will improve her Neck Disability Index Score by at least 12% as a demonstration of impoved function.     Baseline  22% (03/20/2017); 18% (05/01/2017); 6% (06/14/2017)    Time  6    Period  Weeks    Status  Achieved            Plan - 07/19/17 1045    Clinical Impression Statement  Pt reports she will be out of town until the first week in February and will be focusing on consistently completing her HEP as previously prescribed.  She tolerated all interventions well this date but continues to demonstrate L shoulder F AROM deficits.  Pt demonstrates fatigue with scapular retractions this date.  Pt will benefit from continued skilled PT for improved ROM, strength, and functional use of LUE.     Rehab Potential  Good    Clinical Impairments Affecting Rehab Potential  Chronicity of condition, hx of CA    PT Frequency  2x / week    PT Duration  6 weeks    PT Treatment/Interventions  Therapeutic exercise;Therapeutic activities;Manual techniques;Patient/family education;Neuromuscular re-education;Ultrasound    PT Next Visit Plan  scapular strengthening, posterior L shoulder capsule stretch, L shoulder ROM, rotator cuff strengthening     Consulted and Agree with Plan of Care  Patient       Patient will benefit from skilled therapeutic intervention in order to improve the following deficits and impairments:  Pain, Postural dysfunction, Improper body mechanics, Decreased strength, Decreased range of motion  Visit Diagnosis: Cervicalgia  Chronic left shoulder pain     Problem List Patient Active Problem List   Diagnosis Date Noted  . Hypertension 11/22/2016  . History of bilateral breast cancer 07/10/2009    Collie Siad PT, DPT 07/19/2017, 11:12 AM  [facility] [address] [place], [address] Phone: [phone]   Fax:  [phone]  Name: Fatma Rutten MRN: [id number] Date of Birth: [birth year]

## 2017-07-25 ENCOUNTER — Ambulatory Visit: Payer: Self-pay

## 2017-07-25 DIAGNOSIS — Z23 Encounter for immunization: Secondary | ICD-10-CM

## 2017-08-07 DIAGNOSIS — E538 Deficiency of other specified B group vitamins: Secondary | ICD-10-CM | POA: Diagnosis not present

## 2017-10-24 DIAGNOSIS — Z170 Estrogen receptor positive status [ER+]: Secondary | ICD-10-CM | POA: Diagnosis not present

## 2017-10-24 DIAGNOSIS — Z79811 Long term (current) use of aromatase inhibitors: Secondary | ICD-10-CM | POA: Diagnosis not present

## 2017-10-24 DIAGNOSIS — M810 Age-related osteoporosis without current pathological fracture: Secondary | ICD-10-CM | POA: Diagnosis not present

## 2017-10-24 DIAGNOSIS — C50911 Malignant neoplasm of unspecified site of right female breast: Secondary | ICD-10-CM | POA: Diagnosis not present

## 2017-11-17 DIAGNOSIS — E041 Nontoxic single thyroid nodule: Secondary | ICD-10-CM | POA: Diagnosis not present

## 2017-11-17 DIAGNOSIS — D34 Benign neoplasm of thyroid gland: Secondary | ICD-10-CM | POA: Diagnosis not present

## 2017-11-17 DIAGNOSIS — I10 Essential (primary) hypertension: Secondary | ICD-10-CM | POA: Diagnosis not present

## 2017-11-17 DIAGNOSIS — Z853 Personal history of malignant neoplasm of breast: Secondary | ICD-10-CM | POA: Diagnosis not present

## 2017-11-17 DIAGNOSIS — R29810 Facial weakness: Secondary | ICD-10-CM | POA: Diagnosis not present

## 2017-11-17 DIAGNOSIS — Z86011 Personal history of benign neoplasm of the brain: Secondary | ICD-10-CM | POA: Diagnosis not present

## 2017-11-17 DIAGNOSIS — M542 Cervicalgia: Secondary | ICD-10-CM | POA: Diagnosis not present

## 2017-11-17 DIAGNOSIS — G9389 Other specified disorders of brain: Secondary | ICD-10-CM | POA: Diagnosis not present

## 2017-11-17 DIAGNOSIS — D329 Benign neoplasm of meninges, unspecified: Secondary | ICD-10-CM | POA: Diagnosis not present

## 2017-11-17 DIAGNOSIS — E042 Nontoxic multinodular goiter: Secondary | ICD-10-CM | POA: Diagnosis not present

## 2018-02-11 DIAGNOSIS — E538 Deficiency of other specified B group vitamins: Secondary | ICD-10-CM | POA: Diagnosis not present

## 2018-06-03 DIAGNOSIS — C50911 Malignant neoplasm of unspecified site of right female breast: Secondary | ICD-10-CM | POA: Diagnosis not present

## 2018-06-03 DIAGNOSIS — M810 Age-related osteoporosis without current pathological fracture: Secondary | ICD-10-CM | POA: Diagnosis not present

## 2018-06-03 DIAGNOSIS — Z170 Estrogen receptor positive status [ER+]: Secondary | ICD-10-CM | POA: Diagnosis not present

## 2018-06-03 DIAGNOSIS — Z79811 Long term (current) use of aromatase inhibitors: Secondary | ICD-10-CM | POA: Diagnosis not present

## 2018-06-04 DIAGNOSIS — C50919 Malignant neoplasm of unspecified site of unspecified female breast: Secondary | ICD-10-CM | POA: Diagnosis not present

## 2018-06-05 DIAGNOSIS — I10 Essential (primary) hypertension: Secondary | ICD-10-CM | POA: Diagnosis not present

## 2018-06-05 DIAGNOSIS — E042 Nontoxic multinodular goiter: Secondary | ICD-10-CM | POA: Diagnosis not present

## 2018-06-05 DIAGNOSIS — M542 Cervicalgia: Secondary | ICD-10-CM | POA: Diagnosis not present

## 2018-06-05 DIAGNOSIS — D496 Neoplasm of unspecified behavior of brain: Secondary | ICD-10-CM | POA: Diagnosis not present

## 2018-06-05 DIAGNOSIS — D329 Benign neoplasm of meninges, unspecified: Secondary | ICD-10-CM | POA: Diagnosis not present

## 2018-06-05 DIAGNOSIS — Z853 Personal history of malignant neoplasm of breast: Secondary | ICD-10-CM | POA: Diagnosis not present

## 2018-06-05 DIAGNOSIS — R29810 Facial weakness: Secondary | ICD-10-CM | POA: Diagnosis not present

## 2018-06-05 DIAGNOSIS — G9389 Other specified disorders of brain: Secondary | ICD-10-CM | POA: Diagnosis not present

## 2018-07-29 IMAGING — CR DG CERVICAL SPINE COMPLETE 4+V
1 series · 6 of 6 positions shown · non-contrast
Comparison: None in PACs

CLINICAL DATA: Onset of left shoulder pain 2 weeks ago which is now
moved into the left side of the neck. No known injury. History of
breast malignancy and osteopenia.

EXAM:
CERVICAL SPINE - COMPLETE 4+ VIEW

[Series 1: dg cervical spine complete · 0.14mm/px · 6 of 6 slices shown]
[im 1/6]
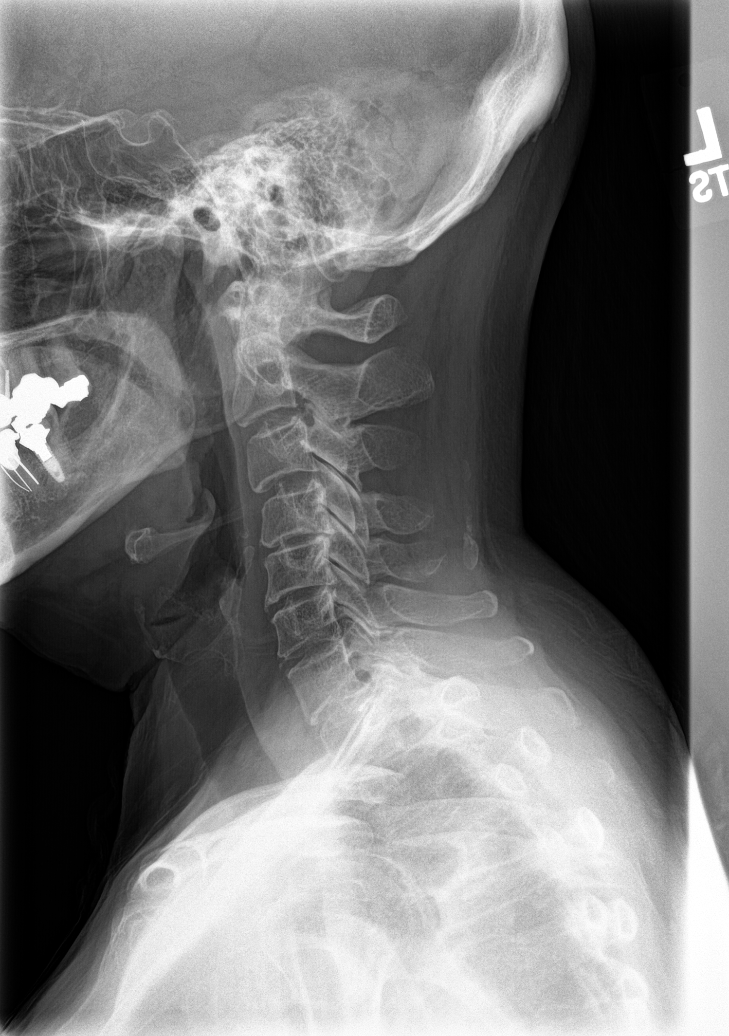
[im 2/6]
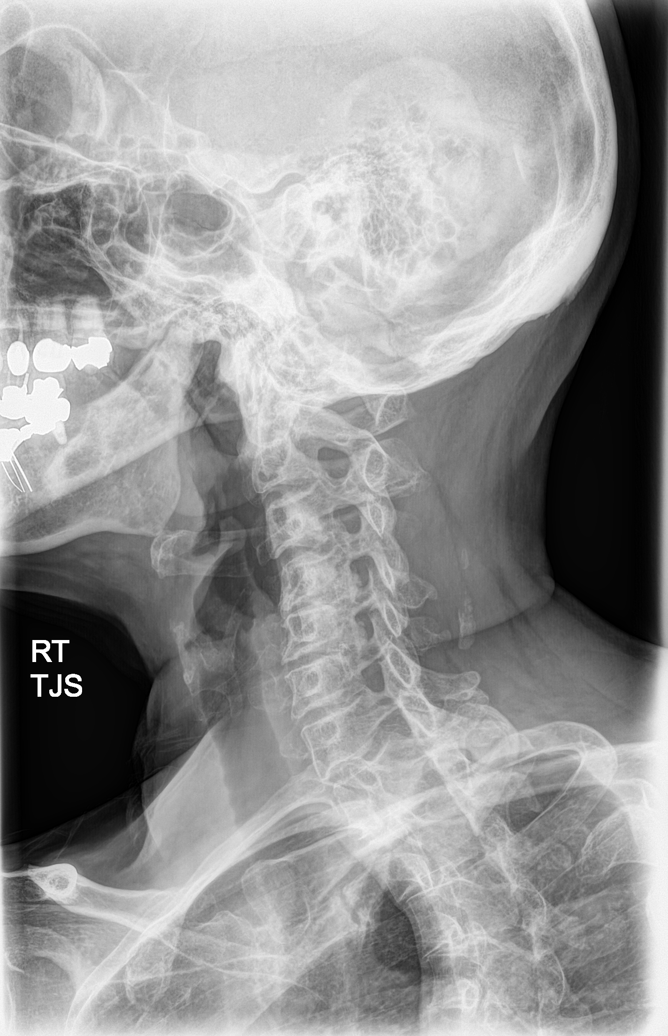
[im 3/6]
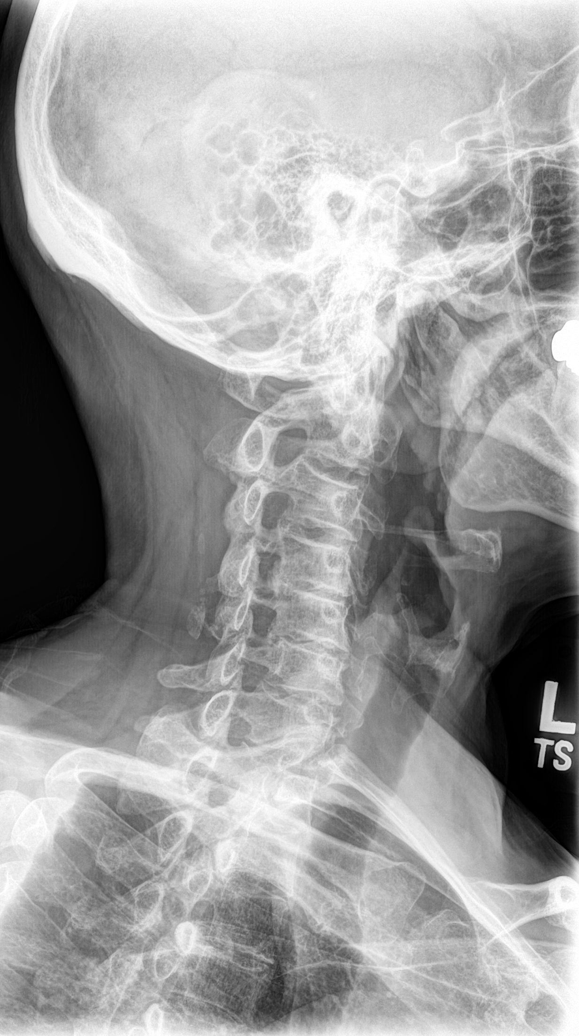
[im 4/6]
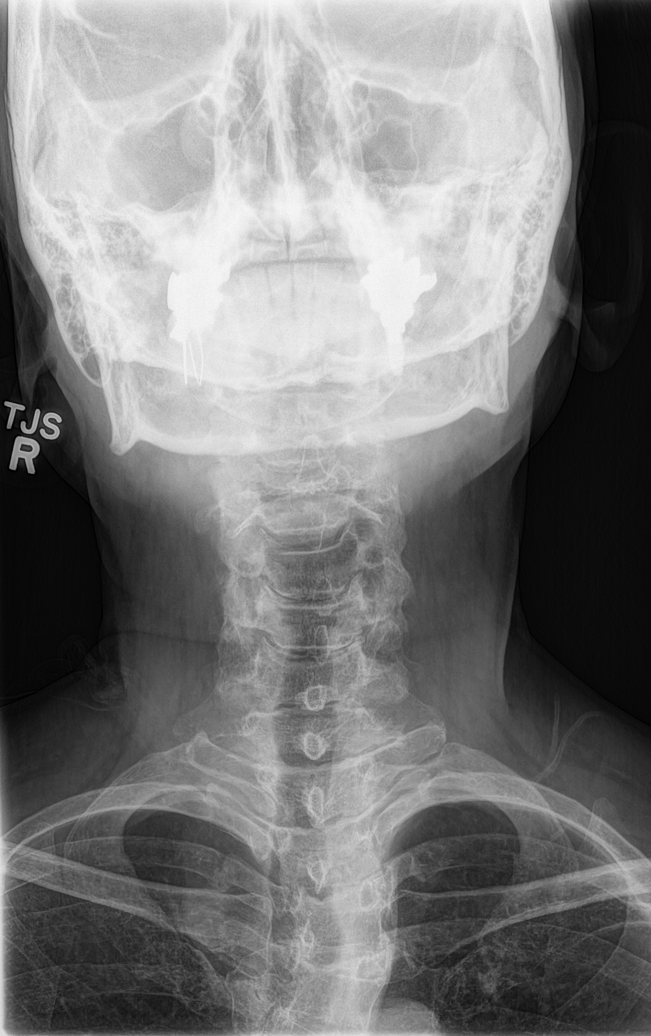
[im 5/6]
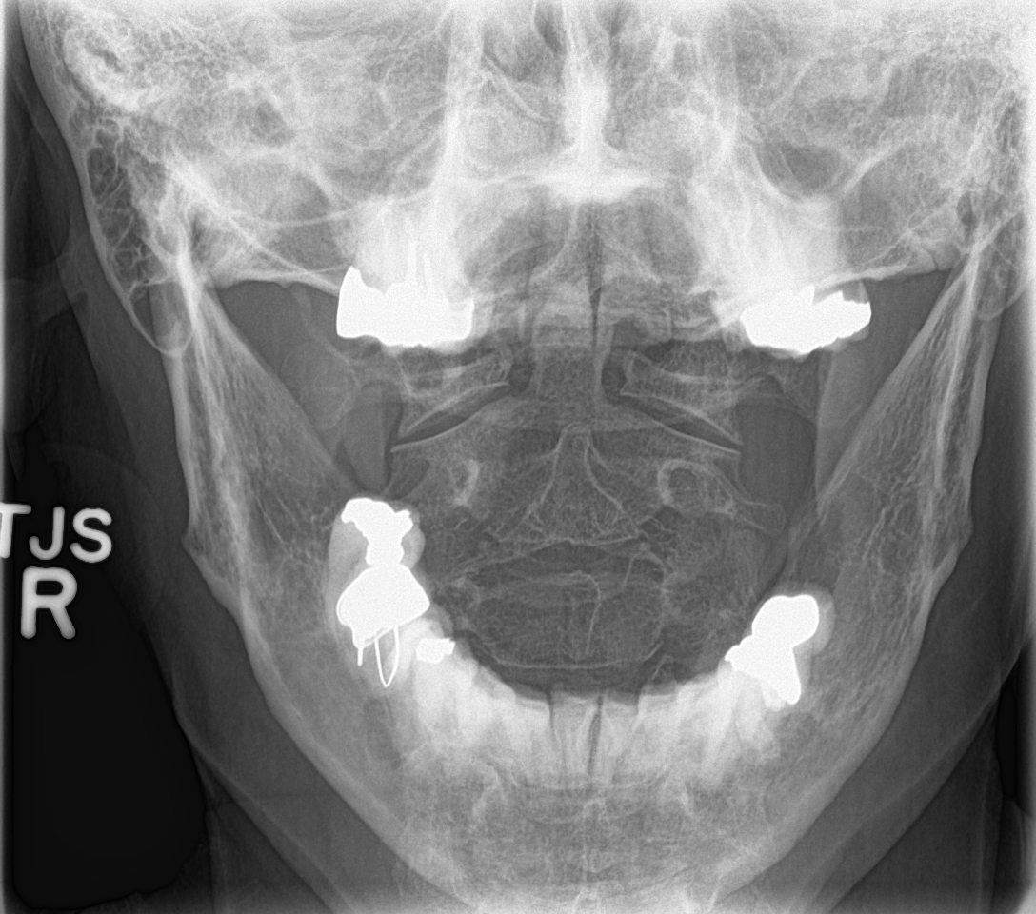
[im 6/6]
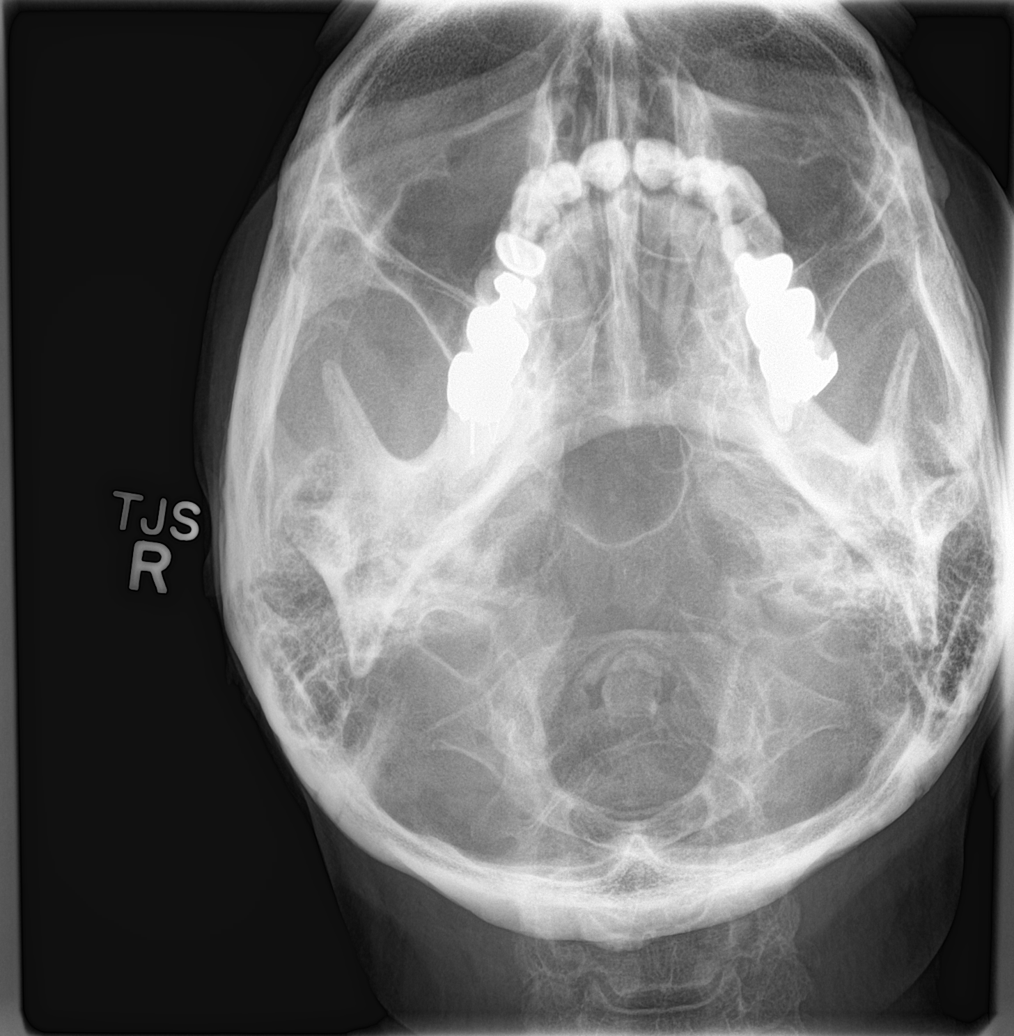

[6 of 6 positions shown; findings below may reference images not displayed]

FINDINGS: The cervical vertebral bodies are preserved in height. There is mild
reversal of the normal cervical lordosis. There is mild disc space
narrowing at C4-5 and to a lesser extent at C5-6. Small anterior
endplate osteophytes are noted at C5-6. The oblique views reveal
mild bony encroachment upon the neural foramina at C4-5 and C5-6
bilaterally. The odontoid and spinous processes are intact. There is
calcification within the nuchal ligament. The prevertebral soft
tissue spaces are normal.
IMPRESSION: There is mild degenerative disc disease at C4-5 and C5-6. There is
facet joint hypertrophy at these levels bilaterally. If the
patient's symptoms persist, MRI of the cervical spine would be a
useful next imaging step.

## 2018-08-02 DIAGNOSIS — C50911 Malignant neoplasm of unspecified site of right female breast: Secondary | ICD-10-CM | POA: Diagnosis not present

## 2018-10-10 ENCOUNTER — Encounter: Payer: Self-pay | Admitting: Nurse Practitioner

## 2018-10-10 ENCOUNTER — Ambulatory Visit: Payer: Self-pay | Admitting: Nurse Practitioner

## 2018-10-10 ENCOUNTER — Other Ambulatory Visit: Payer: Self-pay

## 2018-10-10 VITALS — BP 144/83 | HR 81 | Temp 98.0°F | Resp 16 | Ht 62.0 in | Wt 133.0 lb

## 2018-10-10 DIAGNOSIS — I10 Essential (primary) hypertension: Secondary | ICD-10-CM

## 2018-10-10 MED ORDER — LISINOPRIL 10 MG PO TABS: 10.0000 mg | ORAL_TABLET | Freq: Every day | ORAL | 0 refills | Status: DC

## 2018-10-10 NOTE — Progress Notes (Signed)
   Subjective:    Patient ID: Tanya Olson, female    DOB: 1956-06-25, 63 y.o.   MRN: [id number]  HPI Tanya Olson is here today requesting a refill on her lisinopril. She has been taking this medication for some time, taking as directing and tolerating. She denies any dizziness, headache, SOB, blurred visio, C/p or cough. She reports her MD is located in [place] and unable to get a refill at this time.    Review of Systems  Constitutional: Negative for fever.  Eyes: Negative for visual disturbance.  Respiratory: Negative for cough and shortness of breath.   Cardiovascular: Negative for chest pain.  Neurological: Negative for dizziness and headaches.       Objective:   Physical Exam Vitals signs reviewed.  Constitutional:      Appearance: Normal appearance. She is well-developed.  HENT:     Head: Normocephalic and atraumatic.  Neck:     Musculoskeletal: Normal range of motion and neck supple.     Vascular: No carotid bruit.  Cardiovascular:     Rate and Rhythm: Normal rate and regular rhythm.     Pulses: Normal pulses.     Heart sounds: Normal heart sounds.  Pulmonary:     Effort: Pulmonary effort is normal. No respiratory distress.     Breath sounds: Normal breath sounds.  Abdominal:     General: Bowel sounds are normal.     Palpations: Abdomen is soft. There is no mass.     Tenderness: There is no abdominal tenderness. There is no guarding.  Musculoskeletal: Normal range of motion.  Skin:    General: Skin is warm and dry.  Neurological:     General: No focal deficit present.     Mental Status: She is alert and oriented to person, place, and time.  Psychiatric:        Mood and Affect: Mood normal.           Assessment & Plan:

## 2018-10-10 NOTE — Patient Instructions (Addendum)
Essential hypertension - Plan: CMP14+EGFR, CBC With Differential, lisinopril (PRINIVIL,ZESTRIL) 10 MG tablet  It was nice meeting you today Tanya Olson! Please remember to call the number to find a provider near you. I will notify you through mychart when your labs return and I've sent you a 90 day supply of Lisinopril to your pharmacy choice.  Encouraged to cut back on salt intake Return to the clinic as needed

## 2018-10-11 ENCOUNTER — Encounter: Payer: Self-pay | Admitting: Nurse Practitioner

## 2018-10-11 LAB — CMP14+EGFR
ALT: 23 IU/L (ref 0–32)
AST: 28 IU/L (ref 0–40)
Albumin/Globulin Ratio: 1.6 (ref 1.2–2.2)
Albumin: 4.6 g/dL (ref 3.8–4.8)
Alkaline Phosphatase: 118 IU/L — ABNORMAL HIGH (ref 39–117)
BUN/Creatinine Ratio: 19 (ref 12–28)
BUN: 19 mg/dL (ref 8–27)
Bilirubin Total: 0.4 mg/dL (ref 0.0–1.2)
CO2: 20 mmol/L (ref 20–29)
Calcium: 10 mg/dL (ref 8.7–10.3)
Chloride: 104 mmol/L (ref 96–106)
Creatinine, Ser: 1.02 mg/dL — ABNORMAL HIGH (ref 0.57–1.00)
GFR calc Af Amer: 68 mL/min/{1.73_m2} (ref >59)
GFR calc non Af Amer: 59 mL/min/{1.73_m2} — ABNORMAL LOW (ref >59)
Globulin, Total: 2.9 g/dL (ref 1.5–4.5)
Glucose: 101 mg/dL — ABNORMAL HIGH (ref 65–99)
Potassium: 5.1 mmol/L (ref 3.5–5.2)
Sodium: 140 mmol/L (ref 134–144)
Total Protein: 7.5 g/dL (ref 6.0–8.5)

## 2018-10-11 LAB — CBC WITH DIFFERENTIAL
Basophils Absolute: 0.1 10*3/uL (ref 0.0–0.2)
Basos: 1 %
EOS (ABSOLUTE): 0.1 10*3/uL (ref 0.0–0.4)
Eos: 2 %
Hematocrit: 43.1 % (ref 34.0–46.6)
Hemoglobin: 14.8 g/dL (ref 11.1–15.9)
Immature Grans (Abs): 0 10*3/uL (ref 0.0–0.1)
Immature Granulocytes: 0 %
Lymphocytes Absolute: 1.6 10*3/uL (ref 0.7–3.1)
Lymphs: 27 %
MCH: 30.5 pg (ref 26.6–33.0)
MCHC: 34.3 g/dL (ref 31.5–35.7)
MCV: 89 fL (ref 79–97)
Monocytes Absolute: 0.6 10*3/uL (ref 0.1–0.9)
Monocytes: 10 %
Neutrophils Absolute: 3.6 10*3/uL (ref 1.4–7.0)
Neutrophils: 60 %
RBC: 4.85 x10E6/uL (ref 3.77–5.28)
RDW: 12.3 % (ref 11.7–15.4)
WBC: 6 10*3/uL (ref 3.4–10.8)

## 2018-12-30 ENCOUNTER — Encounter: Payer: Self-pay | Admitting: Medical

## 2018-12-30 ENCOUNTER — Other Ambulatory Visit: Payer: Self-pay | Admitting: Nurse Practitioner

## 2018-12-30 DIAGNOSIS — I10 Essential (primary) hypertension: Secondary | ICD-10-CM

## 2018-12-30 NOTE — Telephone Encounter (Signed)
Patient will need to be seen by PCP prior to getting prescription.  Patient directed to get primary doctor in April.

## 2018-12-30 NOTE — Telephone Encounter (Signed)
Sorry Dr. Nicki Reaper you are correct. Daryll Drown PA-C

## 2018-12-30 NOTE — Telephone Encounter (Signed)
I reviewed this pts chart and I have never seen this pt.  I am unable to refill the medication given that I have not seen her.  Did you mean to send this to someone else?    Dr Nicki Reaper

## 2018-12-30 NOTE — Telephone Encounter (Signed)
Please see if you can refill this patients medication. Sincerely  Daryll Drown PA-C

## 2019-01-07 DIAGNOSIS — Z9882 Breast implant status: Secondary | ICD-10-CM | POA: Diagnosis not present

## 2019-01-07 DIAGNOSIS — M818 Other osteoporosis without current pathological fracture: Secondary | ICD-10-CM | POA: Diagnosis not present

## 2019-01-07 DIAGNOSIS — C50911 Malignant neoplasm of unspecified site of right female breast: Secondary | ICD-10-CM | POA: Diagnosis not present

## 2019-01-07 DIAGNOSIS — Z79811 Long term (current) use of aromatase inhibitors: Secondary | ICD-10-CM | POA: Diagnosis not present

## 2019-01-08 ENCOUNTER — Telehealth: Payer: Self-pay | Admitting: Internal Medicine

## 2019-01-08 ENCOUNTER — Ambulatory Visit (INDEPENDENT_AMBULATORY_CARE_PROVIDER_SITE_OTHER): Payer: BC Managed Care – PPO | Admitting: Internal Medicine

## 2019-01-08 ENCOUNTER — Other Ambulatory Visit: Payer: Self-pay

## 2019-01-08 ENCOUNTER — Encounter: Payer: Self-pay | Admitting: Internal Medicine

## 2019-01-08 DIAGNOSIS — E611 Iron deficiency: Secondary | ICD-10-CM

## 2019-01-08 DIAGNOSIS — Z853 Personal history of malignant neoplasm of breast: Secondary | ICD-10-CM | POA: Diagnosis not present

## 2019-01-08 DIAGNOSIS — Z13220 Encounter for screening for lipoid disorders: Secondary | ICD-10-CM

## 2019-01-08 DIAGNOSIS — M810 Age-related osteoporosis without current pathological fracture: Secondary | ICD-10-CM | POA: Diagnosis not present

## 2019-01-08 DIAGNOSIS — E041 Nontoxic single thyroid nodule: Secondary | ICD-10-CM | POA: Diagnosis not present

## 2019-01-08 DIAGNOSIS — I10 Essential (primary) hypertension: Secondary | ICD-10-CM | POA: Diagnosis not present

## 2019-01-08 DIAGNOSIS — E559 Vitamin D deficiency, unspecified: Secondary | ICD-10-CM

## 2019-01-08 DIAGNOSIS — D329 Benign neoplasm of meninges, unspecified: Secondary | ICD-10-CM | POA: Insufficient documentation

## 2019-01-08 DIAGNOSIS — Q670 Congenital facial asymmetry: Secondary | ICD-10-CM

## 2019-01-08 DIAGNOSIS — Z1389 Encounter for screening for other disorder: Secondary | ICD-10-CM

## 2019-01-08 HISTORY — DX: Nontoxic single thyroid nodule: E04.1

## 2019-01-08 MED ORDER — LISINOPRIL 10 MG PO TABS: 10.0000 mg | ORAL_TABLET | Freq: Every day | ORAL | 3 refills | Status: DC

## 2019-01-08 NOTE — Patient Instructions (Signed)
Shingrix/Recombinant Zoster (Shingles) Vaccine: What You Need to Know 1. Why get vaccinated? Recombinant zoster (shingles) vaccine can prevent shingles. Shingles (also called herpes zoster, or just zoster) is a painful skin rash, usually with blisters. In addition to the rash, shingles can cause fever, headache, chills, or upset stomach. More rarely, shingles can lead to pneumonia, hearing problems, blindness, brain inflammation (encephalitis), or death. The most common complication of shingles is long-term nerve pain called postherpetic neuralgia (PHN). PHN occurs in the areas where the shingles rash was, even after the rash clears up. It can last for months or years after the rash goes away. The pain from PHN can be severe and debilitating. About 10 to 18% of people who get shingles will experience PHN. The risk of PHN increases with age. An older adult with shingles is more likely to develop PHN and have longer lasting and more severe pain than a younger person with shingles. Shingles is caused by the varicella zoster virus, the same virus that causes chickenpox. After you have chickenpox, the virus stays in your body and can cause shingles later in life. Shingles cannot be passed from one person to another, but the virus that causes shingles can spread and cause chickenpox in someone who had never had chickenpox or received chickenpox vaccine. 2. Recombinant shingles vaccine Recombinant shingles vaccine provides strong protection against shingles. By preventing shingles, recombinant shingles vaccine also protects against PHN. Recombinant shingles vaccine is the preferred vaccine for the prevention of shingles. However, a different vaccine, live shingles vaccine, may be used in some circumstances. The recombinant shingles vaccine is recommended for adults 50 years and older without serious immune problems. It is given as a two-dose series. This vaccine is also recommended for people who have already  gotten another type of shingles vaccine, the live shingles vaccine. There is no live virus in this vaccine. Shingles vaccine may be given at the same time as other vaccines. 3. Talk with your health care provider Tell your vaccine provider if the person getting the vaccine:  Has had an allergic reaction after a previous dose of recombinant shingles vaccine, or has any severe, life-threatening allergies.  Is pregnant or breastfeeding.  Is currently experiencing an episode of shingles. In some cases, your health care provider may decide to postpone shingles vaccination to a future visit. People with minor illnesses, such as a cold, may be vaccinated. People who are moderately or severely ill should usually wait until they recover before getting recombinant shingles vaccine. Your health care provider can give you more information. 4. Risks of a vaccine reaction  A sore arm with mild or moderate pain is very common after recombinant shingles vaccine, affecting about 80% of vaccinated people. Redness and swelling can also happen at the site of the injection.  Tiredness, muscle pain, headache, shivering, fever, stomach pain, and nausea happen after vaccination in more than half of people who receive recombinant shingles vaccine. In clinical trials, about 1 out of 6 people who got recombinant zoster vaccine experienced side effects that prevented them from doing regular activities. Symptoms usually went away on their own in 2 to 3 days. You should still get the second dose of recombinant zoster vaccine even if you had one of these reactions after the first dose. People sometimes faint after medical procedures, including vaccination. Tell your provider if you feel dizzy or have vision changes or ringing in the ears. As with any medicine, there is a very remote chance of a vaccine causing  a severe allergic reaction, other serious injury, or death. 5. What if there is a serious problem? An allergic  reaction could occur after the vaccinated person leaves the clinic. If you see signs of a severe allergic reaction (hives, swelling of the face and throat, difficulty breathing, a fast heartbeat, dizziness, or weakness), call 9-1-1 and get the person to the nearest hospital. For other signs that concern you, call your health care provider. Adverse reactions should be reported to the Vaccine Adverse Event Reporting System (VAERS). Your health care provider will usually file this report, or you can do it yourself. Visit the VAERS website at www.vaers.SamedayNews.es or call 939-056-7229. VAERS is only for reporting reactions, and VAERS staff do not give medical advice. 6. How can I learn more?  Ask your health care provider.  Call your local or state health department.  Contact the Centers for Disease Control and Prevention (CDC): ? Call 850-826-8272 (1-800-CDC-INFO) or ? Visit CDC's website at http://hunter.com/ Vaccine Information Statement Recombinant Zoster Vaccine (05/08/2018) This information is not intended to replace advice given to you by your health care provider. Make sure you discuss any questions you have with your health care provider. Document Released: 09/05/2016 Document Revised: 10/15/2018 Document Reviewed: 01/30/2018 Elsevier Patient Education  2020 [employer].

## 2019-01-08 NOTE — Telephone Encounter (Signed)
DEXA +osteoporosis -pt needs to get approved for [facility] down here missed her injection was due 07/2018

## 2019-01-08 NOTE — Progress Notes (Signed)
Pre visit review using our clinic review tool, if applicable. No additional management support is needed unless otherwise documented below in the visit note. 

## 2019-01-08 NOTE — Progress Notes (Addendum)
Virtual Visit via Video Note  I connected with Tanya Olson  on 01/08/19 at  3:50 PM EDT by a video enabled telemedicine application and verified that I am speaking with the correct person using two identifiers.  Location patient: home Location provider:work  Persons participating in the virtual visit: patient, provider  I discussed the limitations of evaluation and management by telemedicine and the availability of in person appointments. The patient expressed understanding and agreed to proceed.   HPI: 1. She has chronic asymmetry of left eyebrow at times getting puffy she is not sure if related to allergies or previous PCP thought thyroid or sinus issues. This comes and goes MRI Q 6 months do not reveal anything and this comes and goes.  2. HTN she is not checking BP but on lisinopril 10 mg qd and needs refill 3. H/o osteoporosis missed prolia injection 07/2018 and needs to get set up to do it in [place] previously had in [place]  4. S/p b/l mastectomy for b/l breast cancer which started on the left breast. FH breast and ovarian cancer. No chemo or radiation she has been on femara x 9 years. She has breast implants which are recalled and surgeon in [place] surgery postponed due to [place] 02/2019 and then will decide surgery will be in [place]: See pertinent positives and negatives per HPI.  Past Medical History:  Diagnosis Date  . Breast cancer ([place]) 11/2009   Bilateral. S/P bilateral mastectomy with reconstruction  . Cancer ([place])    left side initially no chemo or radiation   . Facial asymmetry    left eyebrow puffy intermittently ? Bells vs related thyroid no clear dx in the past   . Frozen shoulder    2019  . Hypertension   . Meningioma [facility])    denies double vision has MRI Q 6 months in [place]   . Multiple thyroid nodules   . Osteoporosis    with family history   . Pancreatitis    severe 2/2 gallstones     Past Surgical History:  Procedure Laterality Date  . BIOPSY THYROID     thyriod bx  benign   . CHOLECYSTECTOMY, LAPAROSCOPIC    . OOPHORECTOMY     b/l     Family History  Problem Relation Age of Onset  . Cancer Sister        triple negative breat cancer BRCA negative   . Cancer Mother        breast   . Diabetes Father   . Cancer Maternal Grandmother        ovarian   . Hypertension Other     SOCIAL HX:  Married with 3 sons     Current Outpatient Medications:  .  denosumab (PROLIA) 60 MG/ML SOLN injection, Inject 60 mg into the skin every 6 (six) months. Administer in upper arm, thigh, or abdomen, Disp: , Rfl:  .  letrozole (FEMARA) 2.5 MG tablet, Take 2.5 mg by mouth once., Disp: , Rfl: 5 .  lisinopril (ZESTRIL) 10 MG tablet, Take 1 tablet (10 mg total) by mouth daily., Disp: 90 tablet, Rfl: 3  EXAM:  VITALS per patient if applicable:  GENERAL: alert, oriented, appears well and in no acute distress  HEENT: atraumatic, conjunttiva clear, no obvious abnormalities on inspection of external nose and ears  NECK: normal movements of the head and neck  LUNGS: on inspection no signs of respiratory distress, breathing rate appears normal, no obvious gross SOB, gasping or  wheezing  CV: no obvious cyanosis  MS: moves all visible extremities without noticeable abnormality  PSYCH/NEURO: pleasant and cooperative, no obvious depression or anxiety, speech and thought processing grossly intact  ASSESSMENT AND PLAN:  Discussed the following assessment and plan:  Essential hypertension - Plan: lisinopril (ZESTRIL) 10 MG tablet,  Fasting labs  BP check with labs   Thyroid nodule - Plan: TSH, T4, free, T3, free  History of bilateral breast cancer - Plan: f/u H/o in [place] cont meds   Osteoporosis, unspecified osteoporosis type, unspecified pathological fracture presence - Plan: need to get prolia approved down here   Facial asymmetry ? Etiology Bells history vs pt c/w thyroid d/o I.e Graves vs MG also in differential- Plan:  MRIs Q6 months no etiology per pt   Consider neurology consult in the future for this   Asx'matic meningioma  MRI Q6 months in [place]   HM Flu utd  Tdap ? Date pt to check  Consider shingrix in future disc and given info   S/p mastectomy for breast cancer with implants that are on recall and need removal surgeon in [place] will schedule surgery to 02/2019 for now  Pap  -overdue no h/o abnormal  -will do in future  DEXA +osteoporosis-pt needs to get approved for prolia down here missed her injection was due 07/2018  Colonoscopy had in 2017 per pt in [place] 1st was normal at that time told to f/u in 5 years. No FH colon cancer      I discussed the assessment and treatment plan with the patient. The patient was provided an opportunity to ask questions and all were answered. The patient agreed with the plan and demonstrated an understanding of the instructions.   The patient was advised to call back or seek an in-person evaluation if the symptoms worsen or if the condition fails to improve as anticipated.  Time spent 25 minutes  Delorise Jackson, MD

## 2019-01-14 ENCOUNTER — Encounter: Payer: Self-pay | Admitting: Internal Medicine

## 2019-01-15 NOTE — Telephone Encounter (Signed)
Insurance verification for Prolia filed on [employer] Portal. 

## 2019-01-16 ENCOUNTER — Ambulatory Visit: Payer: BC Managed Care – PPO

## 2019-01-16 ENCOUNTER — Other Ambulatory Visit: Payer: BC Managed Care – PPO

## 2019-01-16 ENCOUNTER — Other Ambulatory Visit: Payer: Self-pay

## 2019-01-28 ENCOUNTER — Other Ambulatory Visit (INDEPENDENT_AMBULATORY_CARE_PROVIDER_SITE_OTHER): Payer: BC Managed Care – PPO

## 2019-01-28 ENCOUNTER — Other Ambulatory Visit: Payer: Self-pay

## 2019-01-28 ENCOUNTER — Ambulatory Visit (INDEPENDENT_AMBULATORY_CARE_PROVIDER_SITE_OTHER): Payer: BC Managed Care – PPO

## 2019-01-28 VITALS — BP 120/70 | HR 73

## 2019-01-28 DIAGNOSIS — I10 Essential (primary) hypertension: Secondary | ICD-10-CM

## 2019-01-28 DIAGNOSIS — E559 Vitamin D deficiency, unspecified: Secondary | ICD-10-CM

## 2019-01-28 DIAGNOSIS — Z1389 Encounter for screening for other disorder: Secondary | ICD-10-CM

## 2019-01-28 DIAGNOSIS — Z13220 Encounter for screening for lipoid disorders: Secondary | ICD-10-CM

## 2019-01-28 DIAGNOSIS — E611 Iron deficiency: Secondary | ICD-10-CM | POA: Diagnosis not present

## 2019-01-28 DIAGNOSIS — E041 Nontoxic single thyroid nodule: Secondary | ICD-10-CM | POA: Diagnosis not present

## 2019-01-28 LAB — LIPID PANEL
Cholesterol: 202 mg/dL — ABNORMAL HIGH (ref 0–200)
HDL: 57.4 mg/dL (ref >39.00)
LDL Cholesterol: 121 mg/dL — ABNORMAL HIGH (ref 0–99)
NonHDL: 144.49
Total CHOL/HDL Ratio: 4
Triglycerides: 115 mg/dL (ref 0.0–149.0)
VLDL: 23 mg/dL (ref 0.0–40.0)

## 2019-01-28 LAB — COMPREHENSIVE METABOLIC PANEL
ALT: 17 U/L (ref 0–35)
AST: 22 U/L (ref 0–37)
Albumin: 4.6 g/dL (ref 3.5–5.2)
Alkaline Phosphatase: 118 U/L — ABNORMAL HIGH (ref 39–117)
BUN: 19 mg/dL (ref 6–23)
CO2: 26 mEq/L (ref 19–32)
Calcium: 9.8 mg/dL (ref 8.4–10.5)
Chloride: 104 mEq/L (ref 96–112)
Creatinine, Ser: 0.9 mg/dL (ref 0.40–1.20)
GFR: 63.18 mL/min (ref >60.00)
Glucose, Bld: 102 mg/dL — ABNORMAL HIGH (ref 70–99)
Potassium: 4.7 mEq/L (ref 3.5–5.1)
Sodium: 139 mEq/L (ref 135–145)
Total Bilirubin: 0.6 mg/dL (ref 0.2–1.2)
Total Protein: 7.2 g/dL (ref 6.0–8.3)

## 2019-01-28 LAB — CBC WITH DIFFERENTIAL/PLATELET
Basophils Absolute: 0.1 10*3/uL (ref 0.0–0.1)
Basophils Relative: 1 % (ref 0.0–3.0)
Eosinophils Absolute: 0.1 10*3/uL (ref 0.0–0.7)
Eosinophils Relative: 1.1 % (ref 0.0–5.0)
HCT: 43.2 % (ref 36.0–46.0)
Hemoglobin: 14.2 g/dL (ref 12.0–15.0)
Lymphocytes Relative: 24.4 % (ref 12.0–46.0)
Lymphs Abs: 1.6 10*3/uL (ref 0.7–4.0)
MCHC: 33 g/dL (ref 30.0–36.0)
MCV: 91.9 fl (ref 78.0–100.0)
Monocytes Absolute: 0.5 10*3/uL (ref 0.1–1.0)
Monocytes Relative: 8.1 % (ref 3.0–12.0)
Neutro Abs: 4.2 10*3/uL (ref 1.4–7.7)
Neutrophils Relative %: 65.4 % (ref 43.0–77.0)
Platelets: 348 10*3/uL (ref 150.0–400.0)
RBC: 4.7 Mil/uL (ref 3.87–5.11)
RDW: 13.3 % (ref 11.5–15.5)
WBC: 6.4 10*3/uL (ref 4.0–10.5)

## 2019-01-28 LAB — T4, FREE: Free T4: 0.85 ng/dL (ref 0.60–1.60)

## 2019-01-28 LAB — IBC + FERRITIN
Ferritin: 39.6 ng/mL (ref 10.0–291.0)
Iron: 99 ug/dL (ref 42–145)
Saturation Ratios: 24.3 % (ref 20.0–50.0)
Transferrin: 291 mg/dL (ref 212.0–360.0)

## 2019-01-28 LAB — TSH: TSH: 0.32 u[IU]/mL — ABNORMAL LOW (ref 0.35–4.50)

## 2019-01-28 LAB — VITAMIN D 25 HYDROXY (VIT D DEFICIENCY, FRACTURES): VITD: 39.49 ng/mL (ref 30.00–100.00)

## 2019-01-28 LAB — T3, FREE: T3, Free: 4 pg/mL (ref 2.3–4.2)

## 2019-01-28 NOTE — Progress Notes (Signed)
Patient here for nurse visit BP check per MD order from [place] on 01/08/19.   Patient reports compliance with prescribed BP medications: YES  Patient said she is taking Lisinopril.  Last dose of BP medication: this morning  BP taken in right arm due to pt having blood draw for labs in left arm.  BP Readings from Last 3 Encounters:  01/28/19 120/70  10/10/18 (!) 144/83  12/25/16 140/78   Pulse Readings from Last 3 Encounters:  01/28/19 73  10/10/18 81  12/25/16 [facility] Lamine Laton, CMA

## 2019-01-28 NOTE — Progress Notes (Signed)
BP controlled   [place]

## 2019-01-29 ENCOUNTER — Other Ambulatory Visit: Payer: Self-pay | Admitting: Internal Medicine

## 2019-01-29 DIAGNOSIS — E785 Hyperlipidemia, unspecified: Secondary | ICD-10-CM

## 2019-01-29 DIAGNOSIS — R946 Abnormal results of thyroid function studies: Secondary | ICD-10-CM

## 2019-01-29 DIAGNOSIS — R748 Abnormal levels of other serum enzymes: Secondary | ICD-10-CM

## 2019-01-29 DIAGNOSIS — R319 Hematuria, unspecified: Secondary | ICD-10-CM

## 2019-01-29 LAB — URINALYSIS, ROUTINE W REFLEX MICROSCOPIC
Bacteria, UA: NONE SEEN /HPF
Bilirubin Urine: NEGATIVE
Glucose, UA: NEGATIVE
Hyaline Cast: NONE SEEN /LPF
Ketones, ur: NEGATIVE
Leukocytes,Ua: NEGATIVE
Nitrite: NEGATIVE
RBC / HPF: NONE SEEN /HPF (ref 0–2)
Specific Gravity, Urine: 1.015 (ref 1.001–1.03)
pH: 6 (ref 5.0–8.0)

## 2019-01-29 LAB — MICROSCOPIC MESSAGE

## 2019-01-30 ENCOUNTER — Telehealth: Payer: Self-pay

## 2019-01-30 NOTE — Telephone Encounter (Signed)
Patient presented for a nurse visit on 01/28/19 and wanted to ask Dr. Aundra Dubin if she could get back on the Prolia injection.  Pt said that she was on Prolia when she lived in [place].  Pt said that her last injection was last year in July or August.

## 2019-01-30 NOTE — Telephone Encounter (Signed)
Waiting on Prior authorization from insurance.

## 2019-01-30 NOTE — Telephone Encounter (Signed)
Patient presented for a nurse visit on 01/28/19 and wanted to ask Dr. Aundra Dubin if she could get back on the Prolia injection.  Pt said that she was on Prolia when she lived in [place].  Pt said that her last injection was last year in July or August.   Please approve prolia   Thanks [place]

## 2019-02-04 ENCOUNTER — Ambulatory Visit: Payer: BC Managed Care – PPO | Admitting: Internal Medicine

## 2019-02-06 ENCOUNTER — Telehealth: Payer: Self-pay | Admitting: Internal Medicine

## 2019-02-06 NOTE — Telephone Encounter (Signed)
Tanya Olson Key: [id number], PA started on Cover My Meds.

## 2019-02-06 NOTE — Telephone Encounter (Signed)
Curt Bears from [place] is calling to approve the PA Projolia.  Calling to also educate the office on not to use the word URGENT. URGENT means end of life. If time sensitive use ASAP please.  CB- [phone]

## 2019-02-07 NOTE — Telephone Encounter (Signed)
Prolia has been approved for patient ok to schedule.

## 2019-02-19 ENCOUNTER — Encounter: Payer: Self-pay | Admitting: Internal Medicine

## 2019-02-20 NOTE — Telephone Encounter (Signed)
Patient has been approved.

## 2019-02-20 NOTE — Telephone Encounter (Signed)
Scheduled

## 2019-02-24 ENCOUNTER — Ambulatory Visit (INDEPENDENT_AMBULATORY_CARE_PROVIDER_SITE_OTHER): Payer: BC Managed Care – PPO | Admitting: *Deleted

## 2019-02-24 ENCOUNTER — Other Ambulatory Visit: Payer: Self-pay

## 2019-02-24 DIAGNOSIS — M810 Age-related osteoporosis without current pathological fracture: Secondary | ICD-10-CM

## 2019-02-24 MED ORDER — DENOSUMAB 60 MG/ML ~~LOC~~ SOSY
60.0000 mg | PREFILLED_SYRINGE | Freq: Once | SUBCUTANEOUS | Status: AC
  Administered 2019-02-24: 13:00:00 60 mg via SUBCUTANEOUS

## 2019-02-24 NOTE — Progress Notes (Addendum)
Patient presented for Prolia injection to Left arm Tanya Olson, patient voiced no concerns or complaints during or after injection. 

## 2019-03-20 ENCOUNTER — Ambulatory Visit (INDEPENDENT_AMBULATORY_CARE_PROVIDER_SITE_OTHER): Payer: BC Managed Care – PPO

## 2019-03-20 ENCOUNTER — Other Ambulatory Visit: Payer: Self-pay

## 2019-03-20 DIAGNOSIS — Z23 Encounter for immunization: Secondary | ICD-10-CM | POA: Diagnosis not present

## 2019-05-28 DIAGNOSIS — Z20828 Contact with and (suspected) exposure to other viral communicable diseases: Secondary | ICD-10-CM | POA: Diagnosis not present

## 2019-07-22 ENCOUNTER — Other Ambulatory Visit: Payer: Self-pay | Admitting: Internal Medicine

## 2019-07-22 ENCOUNTER — Encounter: Payer: Self-pay | Admitting: Internal Medicine

## 2019-07-22 ENCOUNTER — Telehealth: Payer: Self-pay | Admitting: Internal Medicine

## 2019-07-22 DIAGNOSIS — C50912 Malignant neoplasm of unspecified site of left female breast: Secondary | ICD-10-CM | POA: Diagnosis not present

## 2019-07-22 DIAGNOSIS — Z79811 Long term (current) use of aromatase inhibitors: Secondary | ICD-10-CM | POA: Diagnosis not present

## 2019-07-22 DIAGNOSIS — Z1231 Encounter for screening mammogram for malignant neoplasm of breast: Secondary | ICD-10-CM

## 2019-07-22 DIAGNOSIS — M810 Age-related osteoporosis without current pathological fracture: Secondary | ICD-10-CM

## 2019-07-22 DIAGNOSIS — Z170 Estrogen receptor positive status [ER+]: Secondary | ICD-10-CM | POA: Diagnosis not present

## 2019-07-22 NOTE — Telephone Encounter (Signed)
oncologist, Dr. Devoria Glassing, in [place]  Pt asked per H/o above  -ordered mammo for implants inform pt  Mail pt copy of labs dexa 2018 so she can get to Dr. Girtha Rm in [place]  Call pt to inform  She can call [facility] and schedule mammogram and bone density    -->she asked if you could possibly do a breast exam of my implants to ensure that everything continues to look fine.  She also asked if you could forward my last blood work results to her, as well as schedule a bone density test for me.

## 2019-08-05 ENCOUNTER — Encounter: Payer: Self-pay | Admitting: Internal Medicine

## 2019-08-06 ENCOUNTER — Encounter: Payer: Self-pay | Admitting: Internal Medicine

## 2019-08-11 ENCOUNTER — Other Ambulatory Visit: Payer: Self-pay | Admitting: Neurological Surgery

## 2019-08-11 DIAGNOSIS — H02402 Unspecified ptosis of left eyelid: Secondary | ICD-10-CM

## 2019-08-11 DIAGNOSIS — D320 Benign neoplasm of cerebral meninges: Secondary | ICD-10-CM

## 2019-08-13 ENCOUNTER — Other Ambulatory Visit: Payer: Self-pay | Admitting: Neurological Surgery

## 2019-08-13 DIAGNOSIS — D320 Benign neoplasm of cerebral meninges: Secondary | ICD-10-CM

## 2019-08-13 DIAGNOSIS — H02402 Unspecified ptosis of left eyelid: Secondary | ICD-10-CM

## 2019-08-25 ENCOUNTER — Ambulatory Visit: Payer: BC Managed Care – PPO

## 2019-09-02 ENCOUNTER — Other Ambulatory Visit: Payer: BC Managed Care – PPO

## 2019-09-02 ENCOUNTER — Ambulatory Visit (INDEPENDENT_AMBULATORY_CARE_PROVIDER_SITE_OTHER): Payer: BC Managed Care – PPO

## 2019-09-02 ENCOUNTER — Other Ambulatory Visit: Payer: Self-pay

## 2019-09-02 DIAGNOSIS — M810 Age-related osteoporosis without current pathological fracture: Secondary | ICD-10-CM | POA: Diagnosis not present

## 2019-09-02 MED ORDER — DENOSUMAB 60 MG/ML ~~LOC~~ SOSY
60.0000 mg | PREFILLED_SYRINGE | Freq: Once | SUBCUTANEOUS | Status: AC
  Administered 2019-09-02: 11:00:00 60 mg via SUBCUTANEOUS

## 2019-09-02 NOTE — Progress Notes (Signed)
Patient presented for 6-month Prolia injection SQ to left arm. Patient tolerated well. 

## 2019-09-03 ENCOUNTER — Ambulatory Visit: Payer: BC Managed Care – PPO

## 2019-09-17 ENCOUNTER — Other Ambulatory Visit: Payer: Self-pay

## 2019-09-17 ENCOUNTER — Ambulatory Visit
Admission: RE | Admit: 2019-09-17 | Discharge: 2019-09-17 | Disposition: A | Discharge status: Home / Self Care | Payer: BC Managed Care – PPO | Source: Ambulatory Visit | Attending: Neurological Surgery | Admitting: Neurological Surgery

## 2019-09-17 DIAGNOSIS — D320 Benign neoplasm of cerebral meninges: Secondary | ICD-10-CM

## 2019-09-17 DIAGNOSIS — D329 Benign neoplasm of meninges, unspecified: Secondary | ICD-10-CM | POA: Diagnosis not present

## 2019-09-17 DIAGNOSIS — H02402 Unspecified ptosis of left eyelid: Secondary | ICD-10-CM

## 2019-09-17 MED ORDER — GADOBENATE DIMEGLUMINE 529 MG/ML IV SOLN
12.0000 mL | Freq: Once | INTRAVENOUS | Status: AC | PRN
  Administered 2019-09-17: 12 mL via INTRAVENOUS

## 2019-09-26 DIAGNOSIS — D329 Benign neoplasm of meninges, unspecified: Secondary | ICD-10-CM | POA: Diagnosis not present

## 2019-10-01 DIAGNOSIS — Z0189 Encounter for other specified special examinations: Secondary | ICD-10-CM | POA: Diagnosis not present

## 2019-10-01 DIAGNOSIS — D329 Benign neoplasm of meninges, unspecified: Secondary | ICD-10-CM | POA: Diagnosis not present

## 2019-10-01 DIAGNOSIS — E042 Nontoxic multinodular goiter: Secondary | ICD-10-CM | POA: Diagnosis not present

## 2019-10-01 DIAGNOSIS — Z7189 Other specified counseling: Secondary | ICD-10-CM | POA: Diagnosis not present

## 2019-10-09 ENCOUNTER — Ambulatory Visit: Payer: BC Managed Care – PPO

## 2019-11-14 DIAGNOSIS — Z79811 Long term (current) use of aromatase inhibitors: Secondary | ICD-10-CM | POA: Diagnosis not present

## 2019-11-14 DIAGNOSIS — Z20828 Contact with and (suspected) exposure to other viral communicable diseases: Secondary | ICD-10-CM | POA: Diagnosis not present

## 2019-11-14 DIAGNOSIS — Z853 Personal history of malignant neoplasm of breast: Secondary | ICD-10-CM | POA: Diagnosis not present

## 2019-11-14 DIAGNOSIS — Z79899 Other long term (current) drug therapy: Secondary | ICD-10-CM | POA: Diagnosis not present

## 2019-11-15 DIAGNOSIS — Z79899 Other long term (current) drug therapy: Secondary | ICD-10-CM | POA: Diagnosis not present

## 2019-11-15 DIAGNOSIS — Z79811 Long term (current) use of aromatase inhibitors: Secondary | ICD-10-CM | POA: Diagnosis not present

## 2019-11-15 DIAGNOSIS — Z853 Personal history of malignant neoplasm of breast: Secondary | ICD-10-CM | POA: Diagnosis not present

## 2019-11-15 DIAGNOSIS — D329 Benign neoplasm of meninges, unspecified: Secondary | ICD-10-CM | POA: Diagnosis not present

## 2019-11-15 DIAGNOSIS — D320 Benign neoplasm of cerebral meninges: Secondary | ICD-10-CM | POA: Diagnosis not present

## 2019-11-19 DIAGNOSIS — D333 Benign neoplasm of cranial nerves: Secondary | ICD-10-CM | POA: Diagnosis not present

## 2019-11-19 DIAGNOSIS — D329 Benign neoplasm of meninges, unspecified: Secondary | ICD-10-CM | POA: Diagnosis not present

## 2019-11-19 DIAGNOSIS — Z7189 Other specified counseling: Secondary | ICD-10-CM | POA: Diagnosis not present

## 2019-12-02 DIAGNOSIS — D329 Benign neoplasm of meninges, unspecified: Secondary | ICD-10-CM | POA: Diagnosis not present

## 2020-01-05 ENCOUNTER — Encounter: Payer: Self-pay | Admitting: Internal Medicine

## 2020-01-05 DIAGNOSIS — I10 Essential (primary) hypertension: Secondary | ICD-10-CM

## 2020-01-06 MED ORDER — LISINOPRIL 10 MG PO TABS: 10.0000 mg | ORAL_TABLET | Freq: Every day | ORAL | 3 refills | Status: DC

## 2020-01-06 NOTE — Telephone Encounter (Signed)
Pt needs lisinopril prescription before Thursday. She is leaving to go out of town and will need it for her trip. She said she has taken medication for years and the pharmacy told her they have tried to reach [place] several times for her prescription.

## 2020-01-09 DIAGNOSIS — R3129 Other microscopic hematuria: Secondary | ICD-10-CM | POA: Insufficient documentation

## 2020-01-09 DIAGNOSIS — D320 Benign neoplasm of cerebral meninges: Secondary | ICD-10-CM | POA: Insufficient documentation

## 2020-01-09 DIAGNOSIS — Z01818 Encounter for other preprocedural examination: Secondary | ICD-10-CM | POA: Diagnosis not present

## 2020-01-09 DIAGNOSIS — H905 Unspecified sensorineural hearing loss: Secondary | ICD-10-CM | POA: Diagnosis not present

## 2020-01-09 DIAGNOSIS — E663 Overweight: Secondary | ICD-10-CM | POA: Insufficient documentation

## 2020-01-09 DIAGNOSIS — I10 Essential (primary) hypertension: Secondary | ICD-10-CM | POA: Insufficient documentation

## 2020-01-09 DIAGNOSIS — Z8639 Personal history of other endocrine, nutritional and metabolic disease: Secondary | ICD-10-CM | POA: Insufficient documentation

## 2020-01-09 DIAGNOSIS — Z01810 Encounter for preprocedural cardiovascular examination: Secondary | ICD-10-CM | POA: Diagnosis not present

## 2020-01-13 DIAGNOSIS — Z853 Personal history of malignant neoplasm of breast: Secondary | ICD-10-CM | POA: Diagnosis not present

## 2020-01-13 DIAGNOSIS — Z20828 Contact with and (suspected) exposure to other viral communicable diseases: Secondary | ICD-10-CM | POA: Diagnosis not present

## 2020-01-15 DIAGNOSIS — D432 Neoplasm of uncertain behavior of brain, unspecified: Secondary | ICD-10-CM | POA: Diagnosis not present

## 2020-01-15 DIAGNOSIS — Z9013 Acquired absence of bilateral breasts and nipples: Secondary | ICD-10-CM | POA: Diagnosis not present

## 2020-01-15 DIAGNOSIS — D320 Benign neoplasm of cerebral meninges: Secondary | ICD-10-CM | POA: Diagnosis not present

## 2020-01-15 DIAGNOSIS — Z853 Personal history of malignant neoplasm of breast: Secondary | ICD-10-CM | POA: Diagnosis not present

## 2020-01-15 DIAGNOSIS — Z79899 Other long term (current) drug therapy: Secondary | ICD-10-CM | POA: Diagnosis not present

## 2020-01-15 DIAGNOSIS — D329 Benign neoplasm of meninges, unspecified: Secondary | ICD-10-CM | POA: Diagnosis not present

## 2020-01-15 DIAGNOSIS — I10 Essential (primary) hypertension: Secondary | ICD-10-CM | POA: Diagnosis not present

## 2020-01-15 DIAGNOSIS — H532 Diplopia: Secondary | ICD-10-CM | POA: Diagnosis not present

## 2020-01-15 DIAGNOSIS — C719 Malignant neoplasm of brain, unspecified: Secondary | ICD-10-CM | POA: Diagnosis not present

## 2020-01-15 DIAGNOSIS — Z79811 Long term (current) use of aromatase inhibitors: Secondary | ICD-10-CM | POA: Diagnosis not present

## 2020-01-15 DIAGNOSIS — G935 Compression of brain: Secondary | ICD-10-CM | POA: Diagnosis not present

## 2020-01-17 DIAGNOSIS — Z9889 Other specified postprocedural states: Secondary | ICD-10-CM | POA: Insufficient documentation

## 2020-01-17 DIAGNOSIS — Z09 Encounter for follow-up examination after completed treatment for conditions other than malignant neoplasm: Secondary | ICD-10-CM

## 2020-01-17 HISTORY — DX: Encounter for follow-up examination after completed treatment for conditions other than malignant neoplasm: Z09

## 2020-02-16 ENCOUNTER — Telehealth: Payer: Self-pay | Admitting: Internal Medicine

## 2020-02-16 NOTE — Telephone Encounter (Signed)
Prolia authorization in process may schedule on or after 03/02/20.

## 2020-02-24 NOTE — Telephone Encounter (Signed)
Insurance requires Prior authorization initiated on Cover my Meds. Tanya Olson Key: [id number]

## 2020-03-04 NOTE — Telephone Encounter (Signed)
This has been approved per Covermymeds.   Key: [id number]  Patient last name: Pugmire DOB: [birth year]

## 2020-03-08 NOTE — Telephone Encounter (Signed)
Patient Prolia is approved please schedule nurse visit for Prolia injection.

## 2020-03-10 ENCOUNTER — Other Ambulatory Visit: Payer: Self-pay

## 2020-03-10 ENCOUNTER — Ambulatory Visit (INDEPENDENT_AMBULATORY_CARE_PROVIDER_SITE_OTHER): Payer: BC Managed Care – PPO

## 2020-03-10 DIAGNOSIS — M810 Age-related osteoporosis without current pathological fracture: Secondary | ICD-10-CM | POA: Diagnosis not present

## 2020-03-10 MED ORDER — DENOSUMAB 60 MG/ML ~~LOC~~ SOSY
60.0000 mg | PREFILLED_SYRINGE | Freq: Once | SUBCUTANEOUS | Status: AC
  Administered 2020-03-10: 60 mg via SUBCUTANEOUS

## 2020-03-10 NOTE — Progress Notes (Signed)
Patient presented for 6-month Prolia injection SQ to left arm. Patient tolerated well. 

## 2020-06-02 DIAGNOSIS — H532 Diplopia: Secondary | ICD-10-CM | POA: Diagnosis not present

## 2020-06-02 DIAGNOSIS — I10 Essential (primary) hypertension: Secondary | ICD-10-CM | POA: Diagnosis not present

## 2020-06-02 DIAGNOSIS — D333 Benign neoplasm of cranial nerves: Secondary | ICD-10-CM | POA: Diagnosis not present

## 2020-06-02 DIAGNOSIS — Z712 Person consulting for explanation of examination or test findings: Secondary | ICD-10-CM | POA: Diagnosis not present

## 2020-06-02 DIAGNOSIS — D320 Benign neoplasm of cerebral meninges: Secondary | ICD-10-CM | POA: Diagnosis not present

## 2020-06-02 DIAGNOSIS — Z9013 Acquired absence of bilateral breasts and nipples: Secondary | ICD-10-CM | POA: Diagnosis not present

## 2020-06-02 DIAGNOSIS — Z853 Personal history of malignant neoplasm of breast: Secondary | ICD-10-CM | POA: Diagnosis not present

## 2020-06-02 DIAGNOSIS — Z9889 Other specified postprocedural states: Secondary | ICD-10-CM | POA: Diagnosis not present

## 2020-06-02 DIAGNOSIS — Z79811 Long term (current) use of aromatase inhibitors: Secondary | ICD-10-CM | POA: Diagnosis not present

## 2020-06-02 DIAGNOSIS — D329 Benign neoplasm of meninges, unspecified: Secondary | ICD-10-CM | POA: Diagnosis not present

## 2020-06-04 DIAGNOSIS — Z79811 Long term (current) use of aromatase inhibitors: Secondary | ICD-10-CM | POA: Diagnosis not present

## 2020-06-04 DIAGNOSIS — Z170 Estrogen receptor positive status [ER+]: Secondary | ICD-10-CM | POA: Diagnosis not present

## 2020-06-04 DIAGNOSIS — C50912 Malignant neoplasm of unspecified site of left female breast: Secondary | ICD-10-CM | POA: Diagnosis not present

## 2020-06-04 DIAGNOSIS — C50911 Malignant neoplasm of unspecified site of right female breast: Secondary | ICD-10-CM | POA: Diagnosis not present

## 2020-08-10 DIAGNOSIS — Z20822 Contact with and (suspected) exposure to covid-19: Secondary | ICD-10-CM | POA: Diagnosis not present

## 2020-08-24 DIAGNOSIS — Z20822 Contact with and (suspected) exposure to covid-19: Secondary | ICD-10-CM | POA: Diagnosis not present

## 2020-08-30 ENCOUNTER — Telehealth: Payer: Self-pay | Admitting: Internal Medicine

## 2020-08-30 NOTE — Telephone Encounter (Signed)
Tanya Olson Key: [id number]  PA started on cover my meds for Prolia.

## 2020-09-08 ENCOUNTER — Other Ambulatory Visit: Payer: Self-pay

## 2020-09-08 ENCOUNTER — Ambulatory Visit (INDEPENDENT_AMBULATORY_CARE_PROVIDER_SITE_OTHER): Payer: BC Managed Care – PPO

## 2020-09-08 DIAGNOSIS — M810 Age-related osteoporosis without current pathological fracture: Secondary | ICD-10-CM

## 2020-09-08 MED ORDER — DENOSUMAB 60 MG/ML ~~LOC~~ SOSY
60.0000 mg | PREFILLED_SYRINGE | Freq: Once | SUBCUTANEOUS | Status: AC
  Administered 2020-09-08: 60 mg via SUBCUTANEOUS

## 2020-09-08 NOTE — Progress Notes (Addendum)
Tanya Olson presents today for injection per MD orders. Prolia injection  administered SQ in left Upper Arm. Administration without incident. Patient tolerated well.  Tanya Olson,cma    Agree Dr. Olivia Mackie McLean-Scocuzza

## 2020-10-20 DIAGNOSIS — Z20822 Contact with and (suspected) exposure to covid-19: Secondary | ICD-10-CM | POA: Diagnosis not present

## 2020-12-30 ENCOUNTER — Other Ambulatory Visit: Payer: Self-pay | Admitting: Internal Medicine

## 2020-12-30 DIAGNOSIS — I10 Essential (primary) hypertension: Secondary | ICD-10-CM

## 2021-01-05 DIAGNOSIS — Z9013 Acquired absence of bilateral breasts and nipples: Secondary | ICD-10-CM | POA: Diagnosis not present

## 2021-01-05 DIAGNOSIS — D320 Benign neoplasm of cerebral meninges: Secondary | ICD-10-CM | POA: Diagnosis not present

## 2021-01-05 DIAGNOSIS — Z79811 Long term (current) use of aromatase inhibitors: Secondary | ICD-10-CM | POA: Diagnosis not present

## 2021-01-05 DIAGNOSIS — I10 Essential (primary) hypertension: Secondary | ICD-10-CM | POA: Diagnosis not present

## 2021-01-05 DIAGNOSIS — H532 Diplopia: Secondary | ICD-10-CM | POA: Diagnosis not present

## 2021-01-05 DIAGNOSIS — Z853 Personal history of malignant neoplasm of breast: Secondary | ICD-10-CM | POA: Diagnosis not present

## 2021-01-07 ENCOUNTER — Telehealth (INDEPENDENT_AMBULATORY_CARE_PROVIDER_SITE_OTHER): Payer: BC Managed Care – PPO | Admitting: Internal Medicine

## 2021-01-07 ENCOUNTER — Encounter: Payer: Self-pay | Admitting: Internal Medicine

## 2021-01-07 ENCOUNTER — Other Ambulatory Visit: Payer: Self-pay

## 2021-01-07 ENCOUNTER — Telehealth: Payer: Self-pay

## 2021-01-07 VITALS — Ht 62.0 in | Wt 130.0 lb

## 2021-01-07 DIAGNOSIS — R059 Cough, unspecified: Secondary | ICD-10-CM | POA: Diagnosis not present

## 2021-01-07 DIAGNOSIS — U071 COVID-19: Secondary | ICD-10-CM

## 2021-01-07 MED ORDER — DM-GUAIFENESIN ER 60-1200 MG PO TB12: 1.0000 | ORAL_TABLET | Freq: Two times a day (BID) | ORAL | 0 refills | Status: DC

## 2021-01-07 MED ORDER — NIRMATRELVIR/RITONAVIR (PAXLOVID)TABLET: 3.0000 | ORAL_TABLET | Freq: Two times a day (BID) | ORAL | 0 refills | Status: AC

## 2021-01-07 NOTE — Telephone Encounter (Signed)
PT called again in regards to the Paxlovid to see if anything has been done in regards to it.

## 2021-01-07 NOTE — Progress Notes (Signed)
Virtual Visit via Video Note  I connected with Tanya Olson  on 01/07/21 at 10:45 AM EDT by a video enabled telemedicine application and verified that I am speaking with the correct person using two identifiers.  Location patient: home, Tanya Olson Location provider:work or home office Persons participating in the virtual visit: patient, provider  I discussed the limitations of evaluation and management by telemedicine and the availability of in person appointments. The patient expressed understanding and agreed to proceed.   HPI:  Acute telemedicine visit for : Covid + 01/06/21 husband tested + weds qhs had sx's I.e sore throat, cough, congestion, tried dayquil, nyquil advil for h/a cough worse at night rec mucinex dm green label  Had 4/4 covid shots -COVID-19 vaccine status: 4/4  ROS: See pertinent positives and negatives per HPI.  Past Medical History:  Diagnosis Date   Breast cancer ([place]) 11/2009   Bilateral. S/P bilateral mastectomy with reconstruction   Cancer ([place])    left side initially no chemo or radiation    Facial asymmetry    left eyebrow puffy intermittently ? Bells vs related thyroid no clear dx in the past    Frozen shoulder    2019   Hypertension    Meningioma ([place])    denies double vision has MRI Q 6 months in [place]    Multiple thyroid nodules    Osteoporosis    with family history    Pancreatitis    severe 2/2 gallstones     Past Surgical History:  Procedure Laterality Date   BIOPSY THYROID     thyriod bx benign    BRAIN SURGERY     2022   CHOLECYSTECTOMY, LAPAROSCOPIC     OOPHORECTOMY     b/l    PLACEMENT OF BREAST IMPLANTS       Current Outpatient Medications:    Dextromethorphan-Guaifenesin 60-1200 MG 12hr tablet, Take 1 tablet by mouth every 12 (twelve) hours. Prn green label, Disp: 60 tablet, Rfl: 0   lisinopril (ZESTRIL) 10 MG tablet, TAKE 1 TABLET BY MOUTH EVERY DAY, Disp: 90 tablet, Rfl: 3   nirmatrelvir/ritonavir EUA (PAXLOVID) TABS, Take 3  tablets by mouth 2 (two) times daily for 5 days. (Take nirmatrelvir 150 mg two tablets twice daily for 5 days and ritonavir 100 mg one tablet twice daily for 5 days) Patient GFR is 63.18, Disp: 30 tablet, Rfl: 0   letrozole (FEMARA) 2.5 MG tablet, Take 2.5 mg by mouth once., Disp: , Rfl: 5  EXAM:  VITALS per patient if applicable:  GENERAL: alert, oriented, appears well and in no acute distress  HEENT: atraumatic, conjunttiva clear, no obvious abnormalities on inspection of external nose and ears  NECK: normal movements of the head and neck  LUNGS: on inspection no signs of respiratory distress, breathing rate appears normal, no obvious gross SOB, gasping or wheezing  CV: no obvious cyanosis  MS: moves all visible extremities without noticeable abnormality  PSYCH/NEURO: pleasant and cooperative, no obvious depression or anxiety, speech and thought processing grossly intact  ASSESSMENT AND PLAN:  Discussed the following assessment and plan:  COVID-19 - Plan: Basic Metabolic Panel (BMET), nirmatrelvir/ritonavir EUA (PAXLOVID) TABS, Dextromethorphan-Guaifenesin 60-1200 MG 12hr tablet Cough - Plan: Dextromethorphan-Guaifenesin 60-1200 MG 12hr tablet Mucinex dm green label for cough.  Vitamin C 1000 mg daily.  Vitamin D3 4000 Iu (units) daily.  Zinc 100 mg daily.  Quercetin 250-500 mg 2 times per day   Elderberry  Oil of oregano  cepacol or chloroseptic spray  Warm  salt water gargles  Warm tea with honey and lemon  Hydration  Try to eat though you dont feel like it   Tylenol or Advil  Nasal saline and Flonase 2 sprays as needed nasal congestion over the counter     Monitor pulse oximeter, buy from [facility] if oxygen is less than 90 please go to the hospital.        Are you feeling really sick? Shortness of breath, cough, chest pain?, dizziness? Confusion   If so let me know  If worsening, go to hospital or [facility] clinic Urgent care for further treatment.   -we discussed  possible serious and likely etiologies, options for evaluation and workup, limitations of telemedicine visit vs in person visit, treatment, treatment risks and precautions. Pt prefers to treat via telemedicine empirically rather than in person at this moment.   I discussed the assessment and treatment plan with the patient. The patient was provided an opportunity to ask questions and all were answered. The patient agreed with the plan and demonstrated an understanding of the instructions.    Time spent 20 min Delorise Jackson, MD

## 2021-01-07 NOTE — Telephone Encounter (Signed)
Pharmacy called for verification plaxlovid as to dosage verbal given from PCP for the regular dose not the lower dose for reduced kidney function.

## 2021-01-07 NOTE — Telephone Encounter (Signed)
For your information  

## 2021-01-07 NOTE — Patient Instructions (Signed)
These are over the counter medication options:  Mucinex dm green label for cough.  Vitamin C 1000 mg daily.  Vitamin D3 4000 Iu (units) daily.  Zinc 100 mg daily.  Quercetin 250-500 mg 2 times per day   Elderberry  Oil of oregano  cepacol or chloroseptic spray  Warm salt water gargles  Warm tea with honey and lemon  Hydration  Try to eat though you dont feel like it   Tylenol or Advil  Nasal saline and Flonase 2 sprays as needed nasal congestion over the counter     Monitor pulse oximeter, buy from [place] if oxygen is less than 90 please go to the hospital.        Are you feeling really sick? Shortness of breath, cough, chest pain?, dizziness? Confusion   If so let me know  If worsening, go to hospital or [facility] clinic Urgent care for further treatment.

## 2021-01-07 NOTE — Telephone Encounter (Signed)
Patient scheduled and worked up for virtual at 10 30 am today

## 2021-01-07 NOTE — Telephone Encounter (Signed)
Pt called and states that she is in [place] and has tested positive for covid. She is requesting Plaxovid. She wants Dr Kelly Services to call that in for her. I let pt know that she would need an appt and she seemed to become very upset and states that "it isn't that hard to call something in." She also states that it should only be a five minute appointment and Dr Olivia Mackie should fit her in today. She wanted a note sent back about her frustrations and expects a call back from Dr Kelly Services.

## 2021-01-11 ENCOUNTER — Encounter: Payer: Self-pay | Admitting: Internal Medicine

## 2021-01-11 DIAGNOSIS — D333 Benign neoplasm of cranial nerves: Secondary | ICD-10-CM | POA: Diagnosis not present

## 2021-01-11 DIAGNOSIS — Z9889 Other specified postprocedural states: Secondary | ICD-10-CM | POA: Diagnosis not present

## 2021-01-11 DIAGNOSIS — Z712 Person consulting for explanation of examination or test findings: Secondary | ICD-10-CM | POA: Diagnosis not present

## 2021-01-11 DIAGNOSIS — D329 Benign neoplasm of meninges, unspecified: Secondary | ICD-10-CM | POA: Diagnosis not present

## 2021-03-09 ENCOUNTER — Telehealth: Payer: Self-pay | Admitting: Internal Medicine

## 2021-03-09 DIAGNOSIS — I10 Essential (primary) hypertension: Secondary | ICD-10-CM

## 2021-03-09 DIAGNOSIS — M810 Age-related osteoporosis without current pathological fracture: Secondary | ICD-10-CM

## 2021-03-09 NOTE — Telephone Encounter (Signed)
Submitted PA cover my meds [id number]

## 2021-03-16 NOTE — Telephone Encounter (Signed)
PA approved patient scheduled.

## 2021-03-17 ENCOUNTER — Ambulatory Visit (INDEPENDENT_AMBULATORY_CARE_PROVIDER_SITE_OTHER): Payer: BC Managed Care – PPO

## 2021-03-17 ENCOUNTER — Other Ambulatory Visit: Payer: Self-pay

## 2021-03-17 DIAGNOSIS — M810 Age-related osteoporosis without current pathological fracture: Secondary | ICD-10-CM

## 2021-03-17 MED ORDER — DENOSUMAB 60 MG/ML ~~LOC~~ SOSY
60.0000 mg | PREFILLED_SYRINGE | Freq: Once | SUBCUTANEOUS | Status: AC
  Administered 2021-03-17: 60 mg via SUBCUTANEOUS

## 2021-03-17 NOTE — Addendum Note (Signed)
Addended by: Nanci Pina on: 03/17/2021 03:32 PM   Modules accepted: Orders

## 2021-03-17 NOTE — Telephone Encounter (Signed)
Called scheduled patient scheduled for labs. Labs entered.

## 2021-03-18 ENCOUNTER — Other Ambulatory Visit (INDEPENDENT_AMBULATORY_CARE_PROVIDER_SITE_OTHER): Payer: BC Managed Care – PPO

## 2021-03-18 DIAGNOSIS — M810 Age-related osteoporosis without current pathological fracture: Secondary | ICD-10-CM | POA: Diagnosis not present

## 2021-03-18 DIAGNOSIS — I10 Essential (primary) hypertension: Secondary | ICD-10-CM | POA: Diagnosis not present

## 2021-03-18 LAB — COMPREHENSIVE METABOLIC PANEL
ALT: 18 U/L (ref 0–35)
AST: 21 U/L (ref 0–37)
Albumin: 4.3 g/dL (ref 3.5–5.2)
Alkaline Phosphatase: 68 U/L (ref 39–117)
BUN: 20 mg/dL (ref 6–23)
CO2: 27 mEq/L (ref 19–32)
Calcium: 9.5 mg/dL (ref 8.4–10.5)
Chloride: 103 mEq/L (ref 96–112)
Creatinine, Ser: 0.89 mg/dL (ref 0.40–1.20)
GFR: 68.03 mL/min (ref >60.00)
Glucose, Bld: 101 mg/dL — ABNORMAL HIGH (ref 70–99)
Potassium: 4.1 mEq/L (ref 3.5–5.1)
Sodium: 139 mEq/L (ref 135–145)
Total Bilirubin: 0.7 mg/dL (ref 0.2–1.2)
Total Protein: 7.2 g/dL (ref 6.0–8.3)

## 2021-03-18 LAB — URINALYSIS
Bilirubin Urine: NEGATIVE
Leukocytes,Ua: NEGATIVE
Nitrite: NEGATIVE
Specific Gravity, Urine: 1.025 (ref 1.000–1.030)
Total Protein, Urine: NEGATIVE
Urine Glucose: NEGATIVE
Urobilinogen, UA: 0.2 (ref 0.0–1.0)
pH: 6 (ref 5.0–8.0)

## 2021-03-18 LAB — CBC WITH DIFFERENTIAL/PLATELET
Basophils Absolute: 0.1 10*3/uL (ref 0.0–0.1)
Basophils Relative: 0.8 % (ref 0.0–3.0)
Eosinophils Absolute: 0.1 10*3/uL (ref 0.0–0.7)
Eosinophils Relative: 1.5 % (ref 0.0–5.0)
HCT: 44.5 % (ref 36.0–46.0)
Hemoglobin: 14.7 g/dL (ref 12.0–15.0)
Lymphocytes Relative: 34 % (ref 12.0–46.0)
Lymphs Abs: 2.3 10*3/uL (ref 0.7–4.0)
MCHC: 33 g/dL (ref 30.0–36.0)
MCV: 91.8 fl (ref 78.0–100.0)
Monocytes Absolute: 0.6 10*3/uL (ref 0.1–1.0)
Monocytes Relative: 9.1 % (ref 3.0–12.0)
Neutro Abs: 3.8 10*3/uL (ref 1.4–7.7)
Neutrophils Relative %: 54.6 % (ref 43.0–77.0)
Platelets: 293 10*3/uL (ref 150.0–400.0)
RBC: 4.85 Mil/uL (ref 3.87–5.11)
RDW: 13.3 % (ref 11.5–15.5)
WBC: 6.9 10*3/uL (ref 4.0–10.5)

## 2021-03-18 LAB — LIPID PANEL
Cholesterol: 208 mg/dL — ABNORMAL HIGH (ref 0–200)
HDL: 53.1 mg/dL (ref >39.00)
NonHDL: 154.52
Total CHOL/HDL Ratio: 4
Triglycerides: 202 mg/dL — ABNORMAL HIGH (ref 0.0–149.0)
VLDL: 40.4 mg/dL — ABNORMAL HIGH (ref 0.0–40.0)

## 2021-03-18 LAB — LDL CHOLESTEROL, DIRECT: Direct LDL: 132 mg/dL

## 2021-04-27 ENCOUNTER — Other Ambulatory Visit: Payer: Self-pay

## 2021-04-27 ENCOUNTER — Ambulatory Visit: Payer: Self-pay

## 2021-04-27 DIAGNOSIS — Z23 Encounter for immunization: Secondary | ICD-10-CM

## 2021-05-11 ENCOUNTER — Encounter: Payer: Self-pay | Admitting: Internal Medicine

## 2021-05-11 ENCOUNTER — Ambulatory Visit: Payer: BC Managed Care – PPO | Admitting: Internal Medicine

## 2021-05-24 IMAGING — MR MR HEAD WO/W CM
13 series · 48 of 48 positions shown · IV contrast (multihance)
Comparison: MRI cervical spine 12/23/2016. No prior brain MRI for
comparison.

CLINICAL DATA: Follow-up meningioma

EXAM:
MRI HEAD WITHOUT AND WITH CONTRAST
TECHNIQUE: Multiplanar, multiecho pulse sequences of the brain and surrounding
structures were obtained without and with intravenous contrast.
CONTRAST:  12mL MULTIHANCE GADOBENATE DIMEGLUMINE 529 MG/ML IV SOLN

[Series 5: T1 · sagittal · 4.0mm · 0.75mm/px · 1 of 31 slices shown (1 of 3)]
[im 1/31]
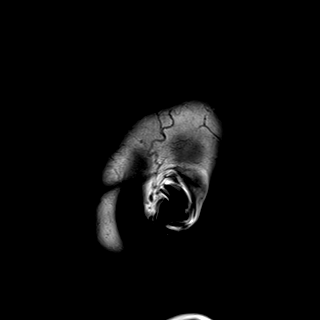

[Series 6: DWI · axial · 3.0mm · 1.44mm/px · z∈[-65,+76]mm · 5 of 88 slices shown (1 of 4)]
[im 1/88]
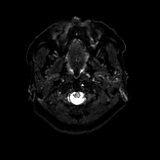
[im 22/88]
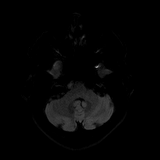
[im 44/88]
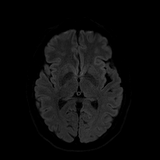
[im 66/88]
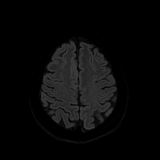
[im 88/88]
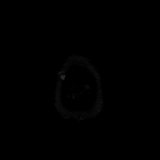

[Series 7: DWI · axial · 3.0mm · 1.44mm/px · z∈[-65,+76]mm · 3 of 44 slices shown (2 of 4)]
[im 1/44]
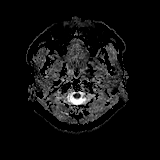
[im 22/44]
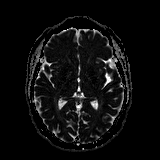
[im 44/44]
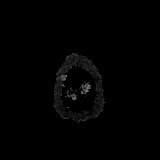

[Series 8: DWI · coronal · 5.0mm · 1.44mm/px · 4 of 64 slices shown (3 of 4)]
[im 1/64]
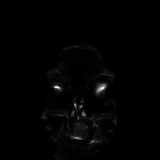
[im 22/64]
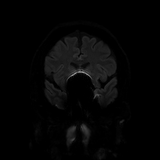
[im 43/64]
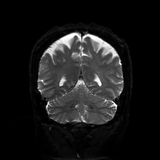
[im 64/64]
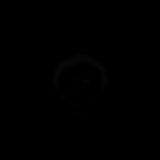

[Series 9: DWI · coronal · 5.0mm · 1.44mm/px · 2 of 32 slices shown (4 of 4)]
[im 1/32]
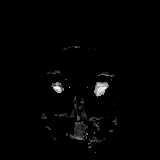
[im 32/32]
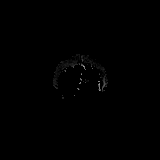

[Series 10: T2 · axial · 4.0mm · 0.36mm/px · z∈[-62,+71]mm · 2 of 27 slices shown]
[im 1/27]
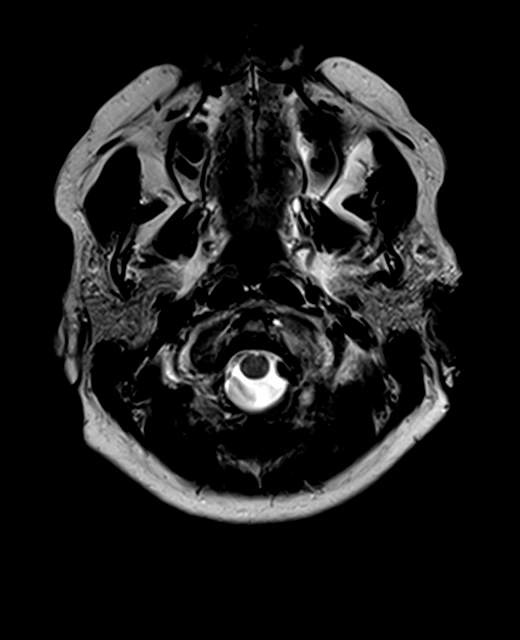
[im 27/27]
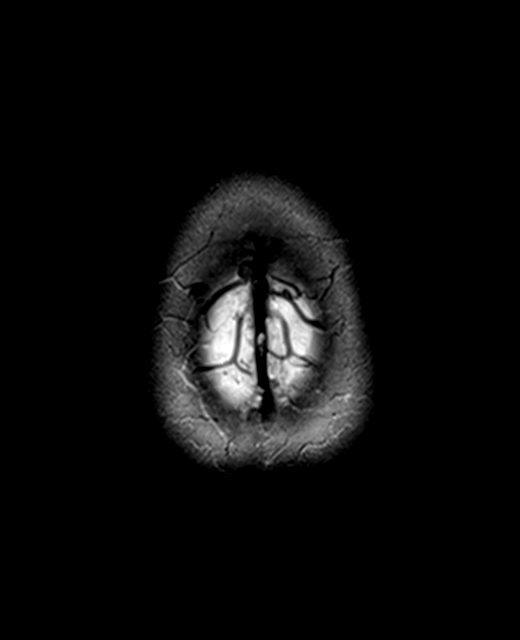

[Series 11: FLAIR · axial · 3.0mm · 0.72mm/px · z∈[-70,+78]mm · 2 of 26 slices shown]
[im 1/26]
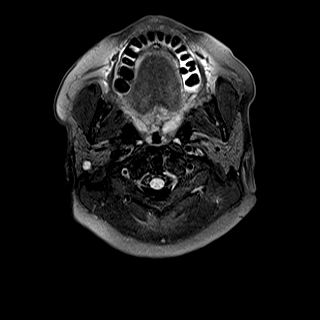
[im 26/26]
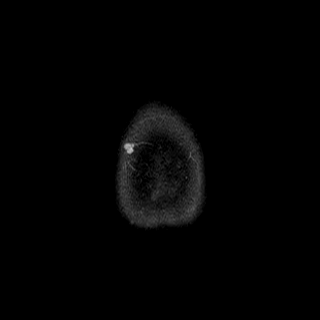

[Series 12: mip_images(sw) · axial · 12.0mm · 0.90mm/px · z∈[-61,+70]mm · 5 of 89 slices shown]
[im 1/89]
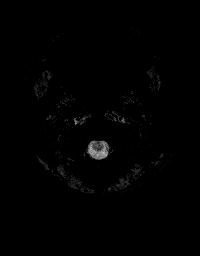
[im 23/89]
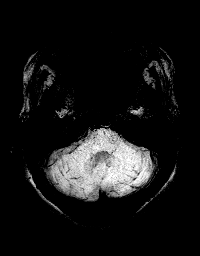
[im 45/89]
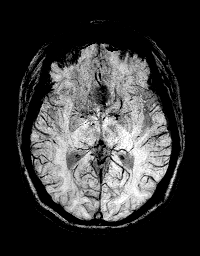
[im 67/89]
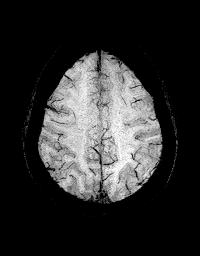
[im 89/89]
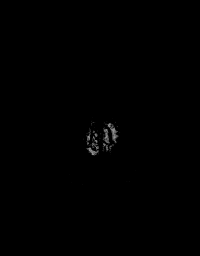

[Series 14: T1 · axial · 1.0mm · 0.94mm/px · z∈[-86,+69]mm · 9 of 160 slices shown (2 of 3)]
[im 1/160]
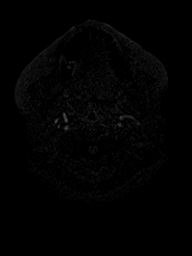
[im 20/160]
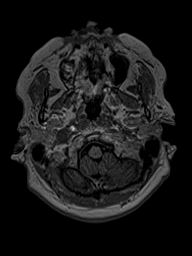
[im 40/160]
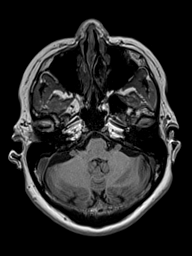
[im 60/160]
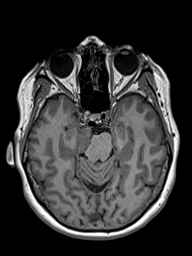
[im 80/160]
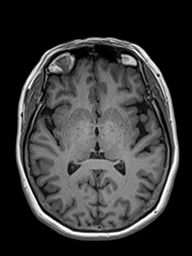
[im 100/160]
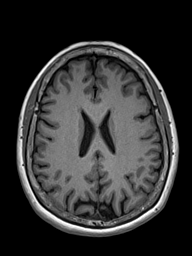
[im 120/160]
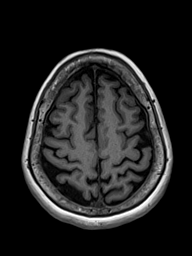
[im 140/160]
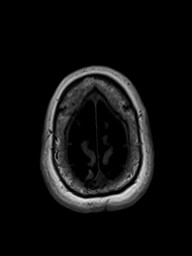
[im 160/160]
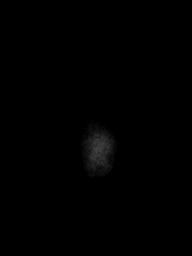

[Series 15: T2 post-contrast · coronal · 4.0mm · 0.36mm/px · 2 of 33 slices shown]
[im 1/33]
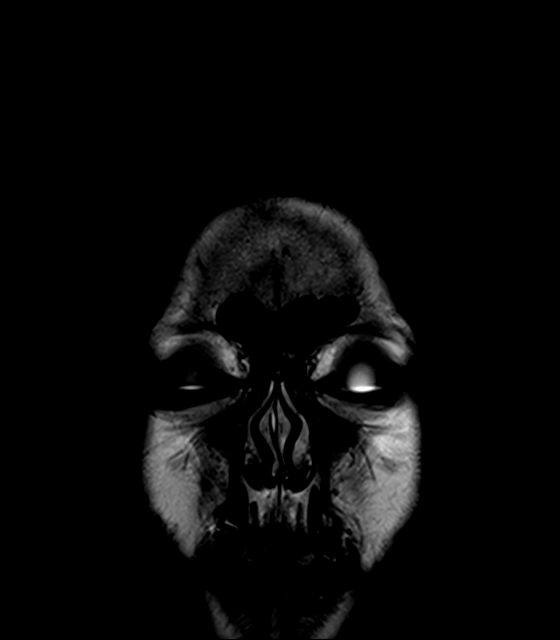
[im 33/33]
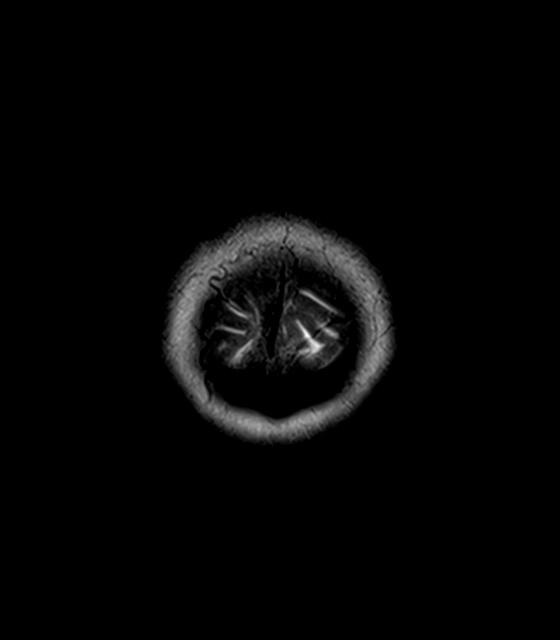

[Series 16: T1 · axial · 1.0mm · 0.94mm/px · z∈[-86,+69]mm · 9 of 160 slices shown (3 of 3)]
[im 1/160]
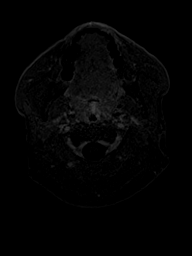
[im 20/160]
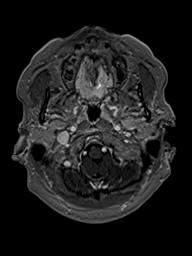
[im 40/160]
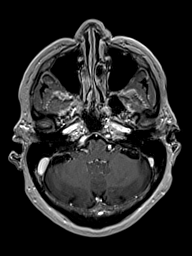
[im 60/160]
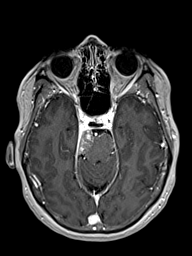
[im 80/160]
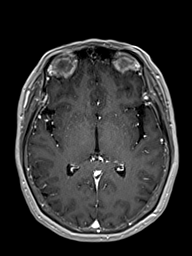
[im 100/160]
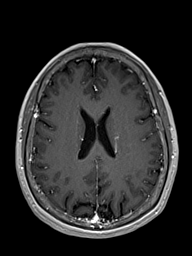
[im 120/160]
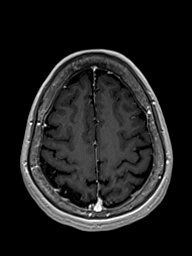
[im 140/160]
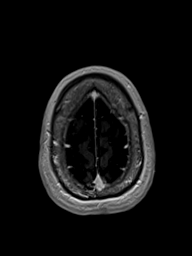
[im 160/160]
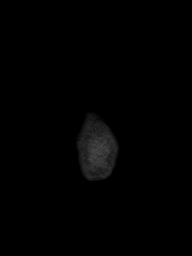

[Series 17: T1 post-contrast · coronal · 4.0mm · 0.72mm/px · 2 of 33 slices shown (1 of 2)]
[im 1/33]
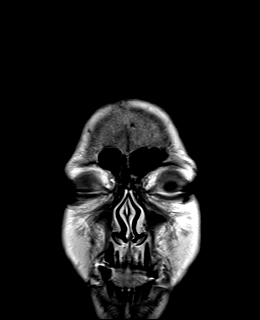
[im 33/33]
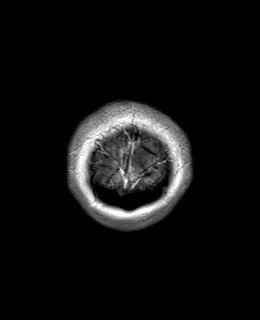

[Series 18: T1 post-contrast · sagittal · 4.0mm · 0.75mm/px · 2 of 31 slices shown (2 of 2)]
[im 1/31]
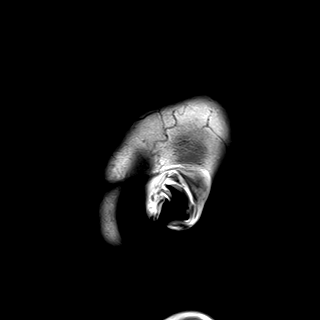
[im 31/31]
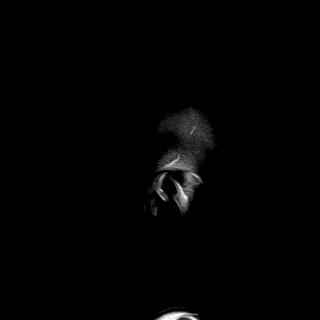

[48 of 48 positions shown; findings below may reference images not displayed]

FINDINGS: Brain: Enhancing extra-axial mass filling the cistern to the right
of the pons with extensive mass-effect on the pons. The mass
measures 2.9 x 2.1 x 2.4 cm. The mass shows solid and mildly
heterogeneous enhancement. There is a broad dural base involving the
anterior tentorium and right temporal bone extending down to the
right internal auditory canal but not extending into the canal. The
mass has grown significantly since the prior cervical spine MRI. The
pons is flattened significantly on the right but there is no edema
in the brain. No second lesion identified

Ventricle size normal.  Negative for infarct or hemorrhage.

Vascular: Normal arterial flow voids.

Skull and upper cervical spine: Negative

Sinuses/Orbits: Mild mucosal edema paranasal sinuses. No orbital
mass. Bilateral staphyloma.

Other: None
IMPRESSION: Enhancing extra-axial mass lesion involving the anterior tentorium
and petrous apex compatible with meningioma. There is extensive
mass-effect on the pons. The lesion has grown significantly since
the cervical MRI of 12/23/2016.

## 2021-06-01 DIAGNOSIS — E042 Nontoxic multinodular goiter: Secondary | ICD-10-CM | POA: Diagnosis not present

## 2021-06-01 DIAGNOSIS — M810 Age-related osteoporosis without current pathological fracture: Secondary | ICD-10-CM | POA: Diagnosis not present

## 2021-06-01 DIAGNOSIS — Z9882 Breast implant status: Secondary | ICD-10-CM | POA: Diagnosis not present

## 2021-06-01 DIAGNOSIS — Z170 Estrogen receptor positive status [ER+]: Secondary | ICD-10-CM | POA: Diagnosis not present

## 2021-06-07 ENCOUNTER — Ambulatory Visit (INDEPENDENT_AMBULATORY_CARE_PROVIDER_SITE_OTHER): Payer: BC Managed Care – PPO | Admitting: Internal Medicine

## 2021-06-07 ENCOUNTER — Telehealth: Payer: Self-pay | Admitting: Internal Medicine

## 2021-06-07 ENCOUNTER — Encounter: Payer: Self-pay | Admitting: Internal Medicine

## 2021-06-07 ENCOUNTER — Other Ambulatory Visit: Payer: Self-pay

## 2021-06-07 ENCOUNTER — Other Ambulatory Visit (HOSPITAL_COMMUNITY)
Admission: RE | Admit: 2021-06-07 | Discharge: 2021-06-07 | Disposition: A | Discharge status: Home / Self Care | Payer: BC Managed Care – PPO | Source: Ambulatory Visit | Attending: Internal Medicine | Admitting: Internal Medicine

## 2021-06-07 VITALS — BP 120/82 | HR 72 | Temp 97.5°F | Ht 60.12 in | Wt 127.8 lb

## 2021-06-07 DIAGNOSIS — E785 Hyperlipidemia, unspecified: Secondary | ICD-10-CM

## 2021-06-07 DIAGNOSIS — Z124 Encounter for screening for malignant neoplasm of cervix: Secondary | ICD-10-CM | POA: Diagnosis not present

## 2021-06-07 DIAGNOSIS — M810 Age-related osteoporosis without current pathological fracture: Secondary | ICD-10-CM

## 2021-06-07 DIAGNOSIS — E042 Nontoxic multinodular goiter: Secondary | ICD-10-CM

## 2021-06-07 DIAGNOSIS — N3000 Acute cystitis without hematuria: Secondary | ICD-10-CM

## 2021-06-07 DIAGNOSIS — I10 Essential (primary) hypertension: Secondary | ICD-10-CM

## 2021-06-07 DIAGNOSIS — R946 Abnormal results of thyroid function studies: Secondary | ICD-10-CM

## 2021-06-07 DIAGNOSIS — R319 Hematuria, unspecified: Secondary | ICD-10-CM

## 2021-06-07 DIAGNOSIS — Z0000 Encounter for general adult medical examination without abnormal findings: Secondary | ICD-10-CM | POA: Diagnosis not present

## 2021-06-07 DIAGNOSIS — H269 Unspecified cataract: Secondary | ICD-10-CM

## 2021-06-07 DIAGNOSIS — E0590 Thyrotoxicosis, unspecified without thyrotoxic crisis or storm: Secondary | ICD-10-CM

## 2021-06-07 DIAGNOSIS — Z23 Encounter for immunization: Secondary | ICD-10-CM

## 2021-06-07 DIAGNOSIS — Z131 Encounter for screening for diabetes mellitus: Secondary | ICD-10-CM | POA: Insufficient documentation

## 2021-06-07 DIAGNOSIS — N841 Polyp of cervix uteri: Secondary | ICD-10-CM

## 2021-06-07 HISTORY — DX: Abnormal results of thyroid function studies: R94.6

## 2021-06-07 MED ORDER — PNEUMOCOCCAL 13-VAL CONJ VACC IM SUSP: 0.5000 mL | Freq: Once | INTRAMUSCULAR | 0 refills | Status: AC

## 2021-06-07 MED ORDER — SHINGRIX 50 MCG/0.5ML IM SUSR: 0.5000 mL | Freq: Once | INTRAMUSCULAR | 1 refills | Status: DC

## 2021-06-07 MED ORDER — TETANUS-DIPHTH-ACELL PERTUSSIS 5-2.5-18.5 LF-MCG/0.5 IM SUSP: 0.5000 mL | Freq: Once | INTRAMUSCULAR | 0 refills | Status: AC

## 2021-06-07 MED ORDER — LISINOPRIL 10 MG PO TABS: 10.0000 mg | ORAL_TABLET | Freq: Every day | ORAL | 3 refills | Status: DC

## 2021-06-07 NOTE — Patient Instructions (Addendum)
Call GI in [place] to see when they recommended last colonoscopy  Consider 5th covid 19 dose 5-6 months after last dose   Consider prevnar, shingrix vaccines, Tdap  [phone] Not available [address], [place], [place] [address]      Specialties     Obstetrics and Gynecology       Pneumococcal Conjugate Vaccine (Prevnar 13) Suspension for Injection What is this medication? PNEUMOCOCCAL VACCINE (NEU mo KOK al vak SEEN) is a vaccine used to prevent pneumococcus bacterial infections. These bacteria can cause serious infections like pneumonia, meningitis, and blood infections. This vaccine will lower your chance of getting pneumonia. If you do get pneumonia, it can make your symptoms milder and your illness shorter. This vaccine will not treat an infection and will not cause infection. This vaccine is recommended for infants and young children, adults with certain medical conditions, and adults 54 years or older. This medicine may be used for other purposes; ask your health care provider or pharmacist if you have questions. COMMON BRAND NAME(S): Prevnar, Prevnar 13 What should I tell my care team before I take this medication? They need to know if you have any of these conditions: bleeding problems fever immune system problems an unusual or allergic reaction to pneumococcal vaccine, diphtheria toxoid, other vaccines, latex, other medicines, foods, dyes, or preservatives pregnant or trying to get pregnant breast-feeding How should I use this medication? This vaccine is for injection into a muscle. It is given by a health care professional. A copy of Vaccine Information Statements will be given before each vaccination. Read this sheet carefully each time. The sheet may change frequently. Talk to your pediatrician regarding the use of this medicine in children. While this drug may be prescribed for children as young as 56 weeks old for  selected conditions, precautions do apply. Overdosage: If you think you have taken too much of this medicine contact a poison control center or emergency room at once. NOTE: This medicine is only for you. Do not share this medicine with others. What if I miss a dose? It is important not to miss your dose. Call your doctor or health care professional if you are unable to keep an appointment. What may interact with this medication? medicines for cancer chemotherapy medicines that suppress your immune function steroid medicines like prednisone or cortisone This list may not describe all possible interactions. Give your health care provider a list of all the medicines, herbs, non-prescription drugs, or dietary supplements you use. Also tell them if you smoke, drink alcohol, or use illegal drugs. Some items may interact with your medicine. What should I watch for while using this medication? Mild fever and pain should go away in 3 days or less. Report any unusual symptoms to your doctor or health care professional. What side effects may I notice from receiving this medication? Side effects that you should report to your doctor or health care professional as soon as possible: allergic reactions like skin rash, itching or hives, swelling of the face, lips, or tongue breathing problems confused fast or irregular heartbeat fever over 102 degrees F seizures unusual bleeding or bruising unusual muscle weakness Side effects that usually do not require medical attention (report to your doctor or health care professional if they continue or are bothersome): aches and pains diarrhea fever of 102 degrees F or less headache irritable loss of appetite pain, tender at site where injected trouble sleeping  This list may not describe all possible side effects. Call your doctor for medical advice about side effects. You may report side effects to FDA at 1-800-FDA-1088. Where should I keep my  medication? This does not apply. This vaccine is given in a clinic, pharmacy, doctor's office, or other health care setting and will not be stored at home. NOTE: This sheet is a summary. It may not cover all possible information. If you have questions about this medicine, talk to your doctor, pharmacist, or health care provider.  2022 Elsevier/Gold Standard (2014-04-02 00:00:00)  Tdap (Tetanus, Diphtheria, Pertussis) Vaccine: What You Need to Know 1. Why get vaccinated? Tdap vaccine can prevent tetanus, diphtheria, and pertussis. Diphtheria and pertussis spread from person to person. Tetanus enters the body through cuts or wounds. TETANUS (T) causes painful stiffening of the muscles. Tetanus can lead to serious health problems, including being unable to open the mouth, having trouble swallowing and breathing, or death. DIPHTHERIA (D) can lead to difficulty breathing, heart failure, paralysis, or death. PERTUSSIS (aP), also known as "whooping cough," can cause uncontrollable, violent coughing that makes it hard to breathe, eat, or drink. Pertussis can be extremely serious especially in babies and young children, causing pneumonia, convulsions, brain damage, or death. In teens and adults, it can cause weight loss, loss of bladder control, passing out, and rib fractures from severe coughing. 2. Tdap vaccine Tdap is only for children 7 years and older, adolescents, and adults.  Adolescents should receive a single dose of Tdap, preferably at age 42 or 52 years. Pregnant people should get a dose of Tdap during every pregnancy, preferably during the early part of the third trimester, to help protect the newborn from pertussis. Infants are most at risk for severe, life-threatening complications from pertussis. Adults who have never received Tdap should get a dose of Tdap. Also, adults should receive a booster dose of either Tdap or Td (a different vaccine that protects against tetanus and diphtheria but  not pertussis) every 10 years, or after 5 years in the case of a severe or dirty wound or burn. Tdap may be given at the same time as other vaccines. 3. Talk with your health care provider Tell your vaccine provider if the person getting the vaccine: Has had an allergic reaction after a previous dose of any vaccine that protects against tetanus, diphtheria, or pertussis, or has any severe, life-threatening allergies Has had a coma, decreased level of consciousness, or prolonged seizures within 7 days after a previous dose of any pertussis vaccine (DTP, DTaP, or Tdap) Has seizures or another nervous system problem Has ever had Guillain-Barr Syndrome (also called "GBS") Has had severe pain or swelling after a previous dose of any vaccine that protects against tetanus or diphtheria In some cases, your health care provider may decide to postpone Tdap vaccination until a future visit. People with minor illnesses, such as a cold, may be vaccinated. People who are moderately or severely ill should usually wait until they recover before getting Tdap vaccine.  Your health care provider can give you more information. 4. Risks of a vaccine reaction Pain, redness, or swelling where the shot was given, mild fever, headache, feeling tired, and nausea, vomiting, diarrhea, or stomachache sometimes happen after Tdap vaccination. People sometimes faint after medical procedures, including vaccination. Tell your provider if you feel dizzy or have vision changes or ringing in the ears.  As with any medicine, there is a very remote chance of a vaccine causing a severe allergic  reaction, other serious injury, or death. 5. What if there is a serious problem? An allergic reaction could occur after the vaccinated person leaves the clinic. If you see signs of a severe allergic reaction (hives, swelling of the face and throat, difficulty breathing, a fast heartbeat, dizziness, or weakness), call 9-1-1 and get the person to  the nearest hospital. For other signs that concern you, call your health care provider.  Adverse reactions should be reported to the Vaccine Adverse Event Reporting System (VAERS). Your health care provider will usually file this report, or you can do it yourself. Visit the VAERS website at www.vaers.SamedayNews.es or call (650)603-1057. VAERS is only for reporting reactions, and VAERS staff members do not give medical advice. 6. The National Vaccine Injury Compensation Program The [employer] Vaccine Injury Compensation Program (VICP) is a federal program that was created to compensate people who may have been injured by certain vaccines. Claims regarding alleged injury or death due to vaccination have a time limit for filing, which may be as short as two years. Visit the VICP website at GoldCloset.com.ee or call 7784838950 to learn about the program and about filing a claim. 7. How can I learn more? Ask your health care provider. Call your local or state health department. Visit the website of the Food and Drug Administration (FDA) for vaccine package inserts and additional information at TraderRating.uy. Contact the Centers for Disease Control and Prevention (CDC): Call 404-201-4108 (1-800-CDC-INFO) or Visit CDC's website at http://hunter.com/. Vaccine Information Statement Tdap (Tetanus, Diphtheria, Pertussis) Vaccine (02/13/2020) This information is not intended to replace advice given to you by your health care provider. Make sure you discuss any questions you have with your health care provider. Document Revised: 03/10/2020 Document Reviewed: 03/10/2020 Elsevier Patient Education  [place].  Zoster Vaccine, Recombinant injection What is this medication? ZOSTER VACCINE (ZOS ter vak SEEN) is a vaccine used to reduce the risk of getting shingles. This vaccine is not used to treat shingles or nerve pain from shingles. This medicine may be  used for other purposes; ask your health care provider or pharmacist if you have questions. COMMON BRAND NAME(S): [facility] What should I tell my care team before I take this medication? They need to know if you have any of these conditions: cancer immune system problems an unusual or allergic reaction to Zoster vaccine, other medications, foods, dyes, or preservatives pregnant or trying to get pregnant breast-feeding How should I use this medication? This vaccine is injected into a muscle. It is given by a health care provider. A copy of Vaccine Information Statements will be given before each vaccination. Be sure to read this information carefully each time. This sheet may change often. Talk to your health care provider about the use of this vaccine in children. This vaccine is not approved for use in children. Overdosage: If you think you have taken too much of this medicine contact a poison control center or emergency room at once. NOTE: This medicine is only for you. Do not share this medicine with others. What if I miss a dose? Keep appointments for follow-up (booster) doses. It is important not to miss your dose. Call your health care provider if you are unable to keep an appointment. What may interact with this medication? medicines that suppress your immune system medicines to treat cancer steroid medicines like prednisone or cortisone This list may not describe all possible interactions. Give your health care provider a list of all the medicines, herbs, non-prescription drugs, or  dietary supplements you use. Also tell them if you smoke, drink alcohol, or use illegal drugs. Some items may interact with your medicine. What should I watch for while using this medication? Visit your health care provider regularly. This vaccine, like all vaccines, may not fully protect everyone. What side effects may I notice from receiving this medication? Side effects that you should report to your  doctor or health care professional as soon as possible: allergic reactions (skin rash, itching or hives; swelling of the face, lips, or tongue) trouble breathing Side effects that usually do not require medical attention (report these to your doctor or health care professional if they continue or are bothersome): chills headache fever nausea pain, redness, or irritation at site where injected tiredness vomiting This list may not describe all possible side effects. Call your doctor for medical advice about side effects. You may report side effects to FDA at 1-800-FDA-1088. Where should I keep my medication? This vaccine is only given by a health care provider. It will not be stored at home. NOTE: This sheet is a summary. It may not cover all possible information. If you have questions about this medicine, talk to your doctor, pharmacist, or health care provider.  2022 Elsevier/Gold Standard (2021-03-15 00:00:00)

## 2021-06-07 NOTE — Progress Notes (Signed)
Chief Complaint  Patient presents with   Annual Exam   Annual Pap due  Meningioma brain s/p surgery may be increasing in size will have repeat Mri 08/2021 to see if needs radiation  Hld last labs 03/2021 was not fasting will repeat today  Hematuria will recheck labs today  Thyroid nodules s/p bx x 3 in the past no f/u in years with [place]  H/o breast cancer s/p b/l mastectomy with implants c/w lymphoma risk onc advised pt to disc with plastic surgery in [place]    Review of Systems  Constitutional:  Negative for weight loss.  HENT:  Negative for hearing loss.   Eyes:  Negative for blurred vision.  Respiratory:  Negative for shortness of breath.   Cardiovascular:  Negative for chest pain.  Gastrointestinal:  Negative for abdominal pain and blood in stool.  Genitourinary:  Negative for dysuria.  Musculoskeletal:  Negative for falls and joint pain.  Skin:  Negative for rash.  Neurological:  Negative for headaches.  Psychiatric/Behavioral:  Negative for depression.   Past Medical History:  Diagnosis Date   Breast cancer ([facility]) 11/2009   Bilateral. S/P bilateral mastectomy with reconstruction   Cancer ([facility])    left side initially no chemo or radiation    COVID-19    01/06/21   Facial asymmetry    left eyebrow puffy intermittently ? Bells vs related thyroid no clear dx in the past    Frozen shoulder    2019   Hypertension    Meningioma [facility])    denies double vision has MRI Q 6 months in [place]; MRI sch 09/02/21 ? radiation   Multiple thyroid nodules    Osteoporosis    with family history    Pancreatitis    severe 2/2 gallstones    Past Surgical History:  Procedure Laterality Date   BIOPSY THYROID     thyriod bx benign    BRAIN SURGERY     2022   CHOLECYSTECTOMY, LAPAROSCOPIC     OOPHORECTOMY     b/l    PLACEMENT OF BREAST IMPLANTS     Family History  Problem Relation Age of Onset   Cancer Sister        triple negative breat cancer BRCA negative    Cancer Mother        breast     Diabetes Father    Cancer Maternal Grandmother        ovarian    Hypertension Other    Social History   Socioeconomic History   Marital status: Married    Spouse name: Not on file   Number of children: Not on file   Years of education: Not on file   Highest education level: Not on file  Occupational History   Not on file  Tobacco Use   Smoking status: Never   Smokeless tobacco: Never  Substance and Sexual Activity   Alcohol use: Yes    Comment: 1-2 drinks per week   Drug use: Never   Sexual activity: Not on file  Other Topics Concern   Not on file  Social History Narrative   Married 3 sons    Husband [place] [employer] [phone] [id number] works at [employer] back and forth here and [employer] in [place] in [place] Determinants of Radio broadcast assistant Strain: Not on file  Food Insecurity: Not on file  Transportation Needs: Not on file  Physical Activity: Not on file  Stress:  Not on file  Social Connections: Not on file  Intimate Partner Violence: Not on file   Current Meds  Medication Sig   CALCIUM CITRATE PO Take by mouth.   denosumab (PROLIA) 60 MG/ML SOSY injection Inject into the skin. Prolia 60 mg/mL subcutaneous solution; 60 milligram(s) subcutaneous every 6 months Quantity: 0 Refills: 0 Ordered: 16-Jan-2020 Jonette Mate LinGeneric Substitution Allowed Comments: denosumab, for bones   Dextromethorphan-Guaifenesin 60-1200 MG 12hr tablet Take 1 tablet by mouth every 12 (twelve) hours. Prn green label   lisinopril (ZESTRIL) 10 MG tablet TAKE 1 TABLET BY MOUTH EVERY DAY   pneumococcal 13-valent conjugate vaccine (PREVNAR 13) SUSP injection Inject 0.5 mLs into the muscle once for 1 dose.   Tdap (BOOSTRIX) 5-2.5-18.5 LF-MCG/0.5 injection Inject 0.5 mLs into the muscle once for 1 dose.   Zoster Vaccine Adjuvanted [facility]) injection Inject 0.5 mLs into the muscle once for 1 dose. X 2 doses   [DISCONTINUED] letrozole (FEMARA) 2.5 MG tablet Take 2.5 mg by mouth  once.   No Known Allergies Recent Results (from the past 2160 hour(s))  Urinalysis     Status: Abnormal   Collection Time: 03/18/21 11:08 AM  Result Value Ref Range   Color, Urine YELLOW Yellow;Lt. Yellow;Straw;Dark Yellow;Amber;Green;Red;Brown   APPearance CLEAR Clear;Turbid;Slightly Cloudy;Cloudy   Specific Gravity, Urine 1.025 1.000 - 1.030   pH 6.0 5.0 - 8.0   Total Protein, Urine NEGATIVE Negative   Urine Glucose NEGATIVE Negative   Ketones, ur TRACE (A) Negative   Bilirubin Urine NEGATIVE Negative   Hgb urine dipstick TRACE-INTACT (A) Negative   Urobilinogen, UA 0.2 0.0 - 1.0   Leukocytes,Ua NEGATIVE Negative   Nitrite NEGATIVE Negative  CBC w/Diff     Status: None   Collection Time: 03/18/21 11:08 AM  Result Value Ref Range   WBC 6.9 4.0 - 10.5 K/uL   RBC 4.85 3.87 - 5.11 Mil/uL   Hemoglobin 14.7 12.0 - 15.0 g/dL   HCT 44.5 36.0 - 46.0 %   MCV 91.8 78.0 - 100.0 fl   MCHC 33.0 30.0 - 36.0 g/dL   RDW 13.3 11.5 - 15.5 %   Platelets 293.0 150.0 - 400.0 K/uL   Neutrophils Relative % 54.6 43.0 - 77.0 %   Lymphocytes Relative 34.0 12.0 - 46.0 %   Monocytes Relative 9.1 3.0 - 12.0 %   Eosinophils Relative 1.5 0.0 - 5.0 %   Basophils Relative 0.8 0.0 - 3.0 %   Neutro Abs 3.8 1.4 - 7.7 K/uL   Lymphs Abs 2.3 0.7 - 4.0 K/uL   Monocytes Absolute 0.6 0.1 - 1.0 K/uL   Eosinophils Absolute 0.1 0.0 - 0.7 K/uL   Basophils Absolute 0.1 0.0 - 0.1 K/uL  Lipid panel     Status: Abnormal   Collection Time: 03/18/21 11:08 AM  Result Value Ref Range   Cholesterol 208 (H) 0 - 200 mg/dL    Comment: ATP III Classification       Desirable:  < 200 mg/dL               Borderline High:  200 - 239 mg/dL          High:  > = 240 mg/dL   Triglycerides 202.0 (H) 0.0 - 149.0 mg/dL    Comment: Normal:  <150 mg/dLBorderline High:  150 - 199 mg/dL   HDL 53.10 >39.00 mg/dL   VLDL 40.4 (H) 0.0 - 40.0 mg/dL   Total CHOL/HDL Ratio 4     Comment:  Men          Women1/2 Average Risk     3.4           3.3Average Risk          5.0          4.42X Average Risk          9.6          7.13X Average Risk          15.0          11.0                       NonHDL 154.52     Comment: NOTE:  Non-HDL goal should be 30 mg/dL higher than patient's LDL goal (i.e. LDL goal of < 70 mg/dL, would have non-HDL goal of < 100 mg/dL)  Comp Met (CMET)     Status: Abnormal   Collection Time: 03/18/21 11:08 AM  Result Value Ref Range   Sodium 139 135 - 145 mEq/L   Potassium 4.1 3.5 - 5.1 mEq/L   Chloride 103 96 - 112 mEq/L   CO2 27 19 - 32 mEq/L   Glucose, Bld 101 (H) 70 - 99 mg/dL   BUN 20 6 - 23 mg/dL   Creatinine, Ser 0.89 0.40 - 1.20 mg/dL   Total Bilirubin 0.7 0.2 - 1.2 mg/dL   Alkaline Phosphatase 68 39 - 117 U/L   AST 21 0 - 37 U/L   ALT 18 0 - 35 U/L   Total Protein 7.2 6.0 - 8.3 g/dL   Albumin 4.3 3.5 - 5.2 g/dL   GFR 68.03 >60.00 mL/min    Comment: Calculated using the CKD-EPI Creatinine Equation (2021)   Calcium 9.5 8.4 - 10.5 mg/dL  LDL cholesterol, direct     Status: None   Collection Time: 03/18/21 11:08 AM  Result Value Ref Range   Direct LDL 132.0 mg/dL    Comment: Optimal:  <100 mg/dLNear or Above Optimal:  100-129 mg/dLBorderline High:  130-159 mg/dLHigh:  160-189 mg/dLVery High:  >190 mg/dL   Objective  Body mass index is 24.86 kg/m. Wt Readings from Last 3 Encounters:  06/07/21 127 lb 12.8 oz (58 kg)  01/07/21 130 lb (59 kg)  10/10/18 133 lb (60.3 kg)   Temp Readings from Last 3 Encounters:  06/07/21 (!) 97.5 F (36.4 C) (Temporal)  10/10/18 98 F (36.7 C)  12/25/16 97.9 F (36.6 C)   BP Readings from Last 3 Encounters:  06/07/21 120/82  01/28/19 120/70  10/10/18 (!) 144/83   Pulse Readings from Last 3 Encounters:  06/07/21 72  01/28/19 73  10/10/18 81    Physical Exam Vitals and nursing note reviewed.  Constitutional:      Appearance: Normal appearance. She is well-developed and well-groomed.  HENT:     Head: Normocephalic and atraumatic.  Eyes:      Conjunctiva/sclera: Conjunctivae normal.     Pupils: Pupils are equal, round, and reactive to light.  Cardiovascular:     Rate and Rhythm: Normal rate and regular rhythm.     Heart sounds: Normal heart sounds. No murmur heard. Pulmonary:     Effort: Pulmonary effort is normal.     Breath sounds: Normal breath sounds.  Chest:     Chest wall: No mass.  Breasts:    Breasts are symmetrical.     Right: Normal.     Left: Normal.     Comments: S/p b/l  implants s/p b/l mastectomy   Abdominal:     General: Abdomen is flat. Bowel sounds are normal.     Tenderness: There is no abdominal tenderness.  Genitourinary:    Exam position: Supine.     Pubic Area: No rash.      Labia:        Right: No rash.        Left: No rash.      Vagina: Normal.     Cervix: Normal.     Uterus: Normal.      Adnexa: Right adnexa normal and left adnexa normal.     Rectum: External hemorrhoid present.     Comments: +? Cervical polyp  Musculoskeletal:        General: No tenderness.  Lymphadenopathy:     Upper Body:     Right upper body: No axillary adenopathy.     Left upper body: No axillary adenopathy.  Skin:    General: Skin is warm and dry.  Neurological:     General: No focal deficit present.     Mental Status: She is alert and oriented to person, place, and time. Mental status is at baseline.     Cranial Nerves: Cranial nerves 2-12 are intact.     Gait: Gait is intact.  Psychiatric:        Attention and Perception: Attention and perception normal.        Mood and Affect: Mood and affect normal.        Speech: Speech normal.        Behavior: Behavior normal. Behavior is cooperative.        Thought Content: Thought content normal.        Cognition and Memory: Cognition and memory normal.        Judgment: Judgment normal.    Assessment  Plan  Annual physical exam Flu utd  Tdap given rx, shingrix and prevnar  Covid 4/4   S/p mastectomy for breast cancer with implants that are on recall and  need removal surgeon in [place] will schedule surgery to 02/2019 for now   Pap ?cervical polyp referred [facility] in [place] -today   Breast exam 06/07/21 with implants s/p b/l mastectomy h/o breast cancer was on femara 2.5 as of 06/07/21 off per h/o  Declines hiv and hep C 06/07/21  DEXA +osteoporosis-pt needs to get approved for prolia down here missed her injection was due 07/2018   Colonoscopy had in 2017 per pt in [place] 1st was normal at that time told to f/u in 5 years. No FH colon cancer  Check with GI in [place] to see when due for f/u    HTN controlled on lis 10 mg qd   Hyperlipidemia, unspecified hyperlipidemia type - Plan: Lipid panel  Abnormal thyroid function test - Plan: TSH, T4, free, T3, free, US THYROID Multiple thyroid nodules - Plan: TSH, T4, free, T3, free, US THYROID  Osteoporosis, unspecified osteoporosis type, unspecified pathological fracture presence - Plan: DG Bone Density Prolia due 09/2021 Q6 months  Cervical polyp - Plan: Ambulatory referral to Obstetrics / Gynecology Routine cervical smear - Plan: Cytology - PAP( [place]), Ambulatory referral to Obstetrics / Gynecology  Hematuria, unspecified type - Plan: Urine Culture, Urinalysis, Routine w reflex microscopic  Cataract, unspecified cataract type, unspecified laterality   Provider: Dr. Olivia Mackie McLean-Scocuzza-Internal Medicine

## 2021-06-07 NOTE — Telephone Encounter (Signed)
Lft pt vm to call ofc to sch US abdomen. thanks 

## 2021-06-08 LAB — LIPID PANEL
Cholesterol: 221 mg/dL — ABNORMAL HIGH (ref 0–200)
HDL: 57.4 mg/dL (ref >39.00)
LDL Cholesterol: 141 mg/dL — ABNORMAL HIGH (ref 0–99)
NonHDL: 163.25
Total CHOL/HDL Ratio: 4
Triglycerides: 110 mg/dL (ref 0.0–149.0)
VLDL: 22 mg/dL (ref 0.0–40.0)

## 2021-06-08 LAB — T4, FREE: Free T4: 0.93 ng/dL (ref 0.60–1.60)

## 2021-06-08 LAB — T3, FREE: T3, Free: 4.2 pg/mL (ref 2.3–4.2)

## 2021-06-08 LAB — TSH: TSH: 0.29 u[IU]/mL — ABNORMAL LOW (ref 0.35–5.50)

## 2021-06-09 LAB — CYTOLOGY - PAP
Comment: NEGATIVE
Diagnosis: NEGATIVE
High risk HPV: NEGATIVE

## 2021-06-10 DIAGNOSIS — E0590 Thyrotoxicosis, unspecified without thyrotoxic crisis or storm: Secondary | ICD-10-CM | POA: Insufficient documentation

## 2021-06-10 LAB — URINE CULTURE
MICRO NUMBER:: 12690030
SPECIMEN QUALITY:: ADEQUATE

## 2021-06-10 LAB — URINALYSIS, ROUTINE W REFLEX MICROSCOPIC
Bilirubin Urine: NEGATIVE
Glucose, UA: NEGATIVE
Hgb urine dipstick: NEGATIVE
Leukocytes,Ua: NEGATIVE
Nitrite: NEGATIVE
Protein, ur: NEGATIVE
Specific Gravity, Urine: 1.006 (ref 1.001–1.035)
pH: 6 (ref 5.0–8.0)

## 2021-06-10 MED ORDER — CIPROFLOXACIN HCL 500 MG PO TABS: 500.0000 mg | ORAL_TABLET | Freq: Two times a day (BID) | ORAL | 0 refills | Status: AC

## 2021-06-10 NOTE — Addendum Note (Signed)
Addended by: Orland Mustard on: 06/10/2021 05:49 PM   Modules accepted: Orders

## 2021-06-10 NOTE — Addendum Note (Signed)
Addended by: Orland Mustard on: 06/10/2021 05:36 PM   Modules accepted: Orders

## 2021-06-14 ENCOUNTER — Other Ambulatory Visit: Payer: Self-pay

## 2021-06-14 ENCOUNTER — Ambulatory Visit
Admission: RE | Admit: 2021-06-14 | Discharge: 2021-06-14 | Disposition: A | Discharge status: Home / Self Care | Payer: BC Managed Care – PPO | Source: Ambulatory Visit | Attending: Internal Medicine | Admitting: Internal Medicine

## 2021-06-14 DIAGNOSIS — R946 Abnormal results of thyroid function studies: Secondary | ICD-10-CM | POA: Insufficient documentation

## 2021-06-14 DIAGNOSIS — E042 Nontoxic multinodular goiter: Secondary | ICD-10-CM | POA: Diagnosis not present

## 2021-06-15 ENCOUNTER — Other Ambulatory Visit: Payer: Self-pay

## 2021-06-15 DIAGNOSIS — Z87440 Personal history of urinary (tract) infections: Secondary | ICD-10-CM

## 2021-06-24 ENCOUNTER — Other Ambulatory Visit: Payer: Self-pay

## 2021-06-24 ENCOUNTER — Other Ambulatory Visit: Payer: BC Managed Care – PPO

## 2021-06-24 DIAGNOSIS — Z87440 Personal history of urinary (tract) infections: Secondary | ICD-10-CM

## 2021-06-24 NOTE — Addendum Note (Signed)
Addended by: Leeanne Rio on: 06/24/2021 03:50 PM   Modules accepted: Orders

## 2021-06-25 LAB — URINE CULTURE
MICRO NUMBER:: 12767689
Result:: NO GROWTH
SPECIMEN QUALITY:: ADEQUATE

## 2021-07-21 DIAGNOSIS — Z86018 Personal history of other benign neoplasm: Secondary | ICD-10-CM | POA: Insufficient documentation

## 2021-07-21 DIAGNOSIS — N841 Polyp of cervix uteri: Secondary | ICD-10-CM | POA: Diagnosis not present

## 2021-07-26 ENCOUNTER — Telehealth: Payer: Self-pay | Admitting: Internal Medicine

## 2021-07-26 NOTE — Telephone Encounter (Signed)
Rejection Reason - Not taking new patients - Unfortunately, due to extremely limited availability, Dr. Gabriel Carina and Dr. Honor Junes are unable to accept new patients at this time. Please re-direct this referral to a different endocrinology office. We apologize for the inconvenience. We have notified your patient of this change." Doyle Askew said on Jul 26, 2021 11:07 AM  Msg from [facility] endo

## 2021-07-26 NOTE — Addendum Note (Signed)
Addended by: Orland Mustard on: 07/26/2021 04:48 PM   Modules accepted: Orders

## 2021-07-26 NOTE — Telephone Encounter (Signed)
Inform we will refer to Dr. Chalmers Cater in [facility]

## 2021-08-17 DIAGNOSIS — E0590 Thyrotoxicosis, unspecified without thyrotoxic crisis or storm: Secondary | ICD-10-CM | POA: Diagnosis not present

## 2021-08-17 DIAGNOSIS — E785 Hyperlipidemia, unspecified: Secondary | ICD-10-CM | POA: Diagnosis not present

## 2021-08-17 DIAGNOSIS — M810 Age-related osteoporosis without current pathological fracture: Secondary | ICD-10-CM | POA: Diagnosis not present

## 2021-08-17 DIAGNOSIS — E049 Nontoxic goiter, unspecified: Secondary | ICD-10-CM | POA: Diagnosis not present

## 2021-08-19 ENCOUNTER — Other Ambulatory Visit: Payer: Self-pay | Admitting: Endocrinology

## 2021-08-19 DIAGNOSIS — E0590 Thyrotoxicosis, unspecified without thyrotoxic crisis or storm: Secondary | ICD-10-CM

## 2021-08-31 DIAGNOSIS — D320 Benign neoplasm of cerebral meninges: Secondary | ICD-10-CM | POA: Diagnosis not present

## 2021-08-31 DIAGNOSIS — Z853 Personal history of malignant neoplasm of breast: Secondary | ICD-10-CM | POA: Diagnosis not present

## 2021-09-14 ENCOUNTER — Ambulatory Visit (INDEPENDENT_AMBULATORY_CARE_PROVIDER_SITE_OTHER): Payer: BC Managed Care – PPO | Admitting: *Deleted

## 2021-09-14 ENCOUNTER — Other Ambulatory Visit: Payer: Self-pay

## 2021-09-14 DIAGNOSIS — M810 Age-related osteoporosis without current pathological fracture: Secondary | ICD-10-CM | POA: Diagnosis not present

## 2021-09-14 MED ORDER — DENOSUMAB 60 MG/ML ~~LOC~~ SOSY
60.0000 mg | PREFILLED_SYRINGE | Freq: Once | SUBCUTANEOUS | Status: AC
  Administered 2021-09-14: 60 mg via SUBCUTANEOUS

## 2021-09-14 NOTE — Progress Notes (Signed)
Patient presented for Prolia injection to Left arm [place], patient voiced no concerns or complaints during or after injection. 

## 2021-12-06 ENCOUNTER — Ambulatory Visit: Payer: BC Managed Care – PPO | Admitting: Internal Medicine

## 2021-12-06 ENCOUNTER — Encounter: Payer: Self-pay | Admitting: Internal Medicine

## 2022-03-20 ENCOUNTER — Ambulatory Visit: Payer: BC Managed Care – PPO

## 2022-03-21 ENCOUNTER — Ambulatory Visit: Payer: BC Managed Care – PPO | Admitting: Family Medicine

## 2022-03-21 ENCOUNTER — Encounter: Payer: Self-pay | Admitting: Family Medicine

## 2022-03-21 VITALS — BP 124/76 | HR 79 | Temp 98.1°F | Ht 60.12 in | Wt 122.8 lb

## 2022-03-21 DIAGNOSIS — E042 Nontoxic multinodular goiter: Secondary | ICD-10-CM

## 2022-03-21 DIAGNOSIS — M810 Age-related osteoporosis without current pathological fracture: Secondary | ICD-10-CM

## 2022-03-21 DIAGNOSIS — Z23 Encounter for immunization: Secondary | ICD-10-CM | POA: Diagnosis not present

## 2022-03-21 DIAGNOSIS — I10 Essential (primary) hypertension: Secondary | ICD-10-CM

## 2022-03-21 DIAGNOSIS — E0590 Thyrotoxicosis, unspecified without thyrotoxic crisis or storm: Secondary | ICD-10-CM | POA: Diagnosis not present

## 2022-03-21 NOTE — Progress Notes (Signed)
    SUBJECTIVE:   CHIEF COMPLAINT / HPI: transfer care  Patient presents to clinic to transfer care  No acute concerns  Plans to have dental surgery.  Wondering if ok to stop Prolia for surgery.  Next injection due in couple of weeks.     Subclinical Hyperthyroid/Thyroid nodules She was seen by endocrinology and reports that plan at that time was to take a medication to stop growth of thyroid.  She had decided not to proceed with this as she had a wedding.  She is now wondering if she should continue to go ahead with procedure  PERTINENT  PMH / PSH:  Osteopenia Thyroid nodules  OBJECTIVE:   BP 124/76 (BP Location: Left Arm, Patient Position: Sitting, Cuff Size: Normal)   Pulse 79   Temp 98.1 F (36.7 C) (Oral)   Ht 5' 0.12" (1.527 m)   Wt 122 lb 12.8 oz (55.7 kg)   SpO2 99%   BMI 23.89 kg/m    General: Alert, no acute distress Cardio: Normal S1 and S2, RRR, no r/m/g Pulm: CTAB, normal work of breathing Abdomen: Bowel sounds normal. Abdomen soft and non-tender.  Extremities: No peripheral edema.  Neuro: Cranial nerves grossly intact   ASSESSMENT/PLAN:   Hypertension Well controlled Continue Lisinopril 10 mg daily Cmet in November   Subclinical hyperthyroidism Last TSH and T4 normal.  Asymptomatic.   Follow up with endocrinology for uptake  Multiple thyroid nodules Iodine uptake previously ordered by Endocrinology Follow up with Endocriniology  Osteoporosis Hold for dental extraction.  Can resume after surgery at surgeons recommendation.    HCM PNA 20 vaccine Flu Vaccine today Obtain previous colonoscopy records, unclear is 5 year or 10 recommendation.  Patient reports was told a 5 year recommendation   PDMP Reviewed  Carollee Leitz, MD

## 2022-03-21 NOTE — Patient Instructions (Addendum)
It was a pleasure meeting you today. Thank you for allowing me to take part in your health care.  Our goals for today as we discussed include:  For your blood pressure Continue Lisinopril 10 mg daily.  Recommend taking at bedtime   Hold off on your next Prolia injection until at least 4 weeks after your procedure.  You can talk to the surgeon for the exact timing to restart.  Will obtain blood work at your annual visit  Call your surgeon to discuss if necessary to remove textured implants  Will get records from last colonoscopy   Please follow-up with PCP in November  If you have any questions or concerns, please do not hesitate to call the office at [phone].  I look forward to our next visit and until then take care and stay safe.  Regards,   Carollee Leitz, MD   [facility]

## 2022-03-25 ENCOUNTER — Encounter: Payer: Self-pay | Admitting: Family Medicine

## 2022-03-25 MED ORDER — LISINOPRIL 10 MG PO TABS: 10.0000 mg | ORAL_TABLET | Freq: Every day | ORAL | 3 refills | Status: DC

## 2022-03-25 NOTE — Assessment & Plan Note (Signed)
Iodine uptake previously ordered by Endocrinology Follow up with Endocriniology

## 2022-03-25 NOTE — Assessment & Plan Note (Signed)
Well controlled Continue Lisinopril 10 mg daily Cmet in November

## 2022-03-25 NOTE — Assessment & Plan Note (Signed)
Last TSH and T4 normal.  Asymptomatic.   Follow up with endocrinology for uptake

## 2022-03-25 NOTE — Assessment & Plan Note (Signed)
Hold for dental extraction.  Can resume after surgery at surgeons recommendation.

## 2022-05-31 DIAGNOSIS — C50911 Malignant neoplasm of unspecified site of right female breast: Secondary | ICD-10-CM | POA: Diagnosis not present

## 2022-05-31 DIAGNOSIS — Z853 Personal history of malignant neoplasm of breast: Secondary | ICD-10-CM | POA: Diagnosis not present

## 2022-05-31 DIAGNOSIS — M810 Age-related osteoporosis without current pathological fracture: Secondary | ICD-10-CM | POA: Diagnosis not present

## 2022-05-31 DIAGNOSIS — Z9882 Breast implant status: Secondary | ICD-10-CM | POA: Diagnosis not present

## 2022-05-31 DIAGNOSIS — Z170 Estrogen receptor positive status [ER+]: Secondary | ICD-10-CM | POA: Diagnosis not present

## 2022-06-08 ENCOUNTER — Ambulatory Visit (INDEPENDENT_AMBULATORY_CARE_PROVIDER_SITE_OTHER): Payer: BC Managed Care – PPO | Admitting: Family Medicine

## 2022-06-08 ENCOUNTER — Encounter: Payer: Self-pay | Admitting: Family Medicine

## 2022-06-08 ENCOUNTER — Encounter: Payer: BC Managed Care – PPO | Admitting: Internal Medicine

## 2022-06-08 ENCOUNTER — Encounter: Payer: BC Managed Care – PPO | Admitting: Family Medicine

## 2022-06-08 VITALS — BP 125/80 | HR 65 | Temp 97.5°F | Ht 60.0 in | Wt 125.8 lb

## 2022-06-08 DIAGNOSIS — Z0000 Encounter for general adult medical examination without abnormal findings: Secondary | ICD-10-CM | POA: Diagnosis not present

## 2022-06-08 DIAGNOSIS — N3000 Acute cystitis without hematuria: Secondary | ICD-10-CM

## 2022-06-08 DIAGNOSIS — M810 Age-related osteoporosis without current pathological fracture: Secondary | ICD-10-CM | POA: Diagnosis not present

## 2022-06-08 DIAGNOSIS — E042 Nontoxic multinodular goiter: Secondary | ICD-10-CM | POA: Diagnosis not present

## 2022-06-08 DIAGNOSIS — E785 Hyperlipidemia, unspecified: Secondary | ICD-10-CM | POA: Diagnosis not present

## 2022-06-08 DIAGNOSIS — R946 Abnormal results of thyroid function studies: Secondary | ICD-10-CM

## 2022-06-08 DIAGNOSIS — I10 Essential (primary) hypertension: Secondary | ICD-10-CM

## 2022-06-08 DIAGNOSIS — Z1211 Encounter for screening for malignant neoplasm of colon: Secondary | ICD-10-CM

## 2022-06-08 DIAGNOSIS — Z853 Personal history of malignant neoplasm of breast: Secondary | ICD-10-CM

## 2022-06-08 LAB — CALCIUM: Calcium: 9.8 mg/dL (ref 8.4–10.5)

## 2022-06-08 LAB — LIPID PANEL
Cholesterol: 219 mg/dL — ABNORMAL HIGH (ref 0–200)
HDL: 62.5 mg/dL (ref >39.00)
LDL Cholesterol: 126 mg/dL — ABNORMAL HIGH (ref 0–99)
NonHDL: 156.05
Total CHOL/HDL Ratio: 3
Triglycerides: 150 mg/dL — ABNORMAL HIGH (ref 0.0–149.0)
VLDL: 30 mg/dL (ref 0.0–40.0)

## 2022-06-08 LAB — VITAMIN D 25 HYDROXY (VIT D DEFICIENCY, FRACTURES): VITD: 55.44 ng/mL (ref 30.00–100.00)

## 2022-06-08 LAB — TSH: TSH: 0.15 u[IU]/mL — ABNORMAL LOW (ref 0.35–5.50)

## 2022-06-08 LAB — VITAMIN B12: Vitamin B-12: 484 pg/mL (ref 211–911)

## 2022-06-08 MED ORDER — DENOSUMAB 60 MG/ML ~~LOC~~ SOSY
60.0000 mg | PREFILLED_SYRINGE | Freq: Once | SUBCUTANEOUS | Status: AC
  Administered 2022-06-08: 60 mg via SUBCUTANEOUS

## 2022-06-08 NOTE — Patient Instructions (Addendum)
It was a pleasure meeting you today. Thank you for allowing me to take part in your health care.  Our goals for today as we discussed include:  We will get some labs today.  If they are abnormal or we need to do something about them, I will call you.  If they are normal, I will send you a message on MyChart (if it is active) or a letter in the mail.  If you don't hear from [place] in 2 weeks, please call the office at the number below.   To check on your MRI please call to see if you can schedule at [facility] [place], [place] [address] [phone]   If you have any questions or concerns, please do not hesitate to call the office at [phone].  I look forward to our next visit and until then take care and stay safe.  Regards,   Carollee Leitz, MD   [facility]

## 2022-06-08 NOTE — Progress Notes (Unsigned)
SUBJECTIVE:   Chief Complaint  Patient presents with   Annual Exam   HPI Patient presents today for physical exam  No acute concerns.  Would like her thyroid checked today.  Prolia injection due today.    PERTINENT PMH / PSH: Hypertension Multinodular thyroid Subclinical hyperthyroidism Osteoporosis History of bilateral breast CA Hyperlipidemia  OBJECTIVE:  BP 125/80   Pulse 65   Temp (!) 97.5 F (36.4 C) (Oral)   Ht 5' (1.524 m)   Wt 125 lb 12.8 oz (57.1 kg)   SpO2 99%   BMI 24.57 kg/m    Physical Exam Vitals reviewed.  Constitutional:      General: She is not in acute distress.    Appearance: She is not ill-appearing.  HENT:     Head: Normocephalic.     Right Ear: Tympanic membrane, ear canal and external ear normal.     Left Ear: Tympanic membrane, ear canal and external ear normal.     Nose: Nose normal.     Mouth/Throat:     Mouth: Mucous membranes are moist.  Eyes:     Extraocular Movements: Extraocular movements intact.     Conjunctiva/sclera: Conjunctivae normal.     Pupils: Pupils are equal, round, and reactive to light.  Neck:     Thyroid: No thyroid mass, thyromegaly or thyroid tenderness.     Vascular: No carotid bruit.  Cardiovascular:     Rate and Rhythm: Normal rate and regular rhythm.     Pulses: Normal pulses.     Heart sounds: Normal heart sounds.  Pulmonary:     Effort: Pulmonary effort is normal.     Breath sounds: Normal breath sounds.  Abdominal:     General: Abdomen is flat. Bowel sounds are normal. There is no distension.     Palpations: Abdomen is soft.     Tenderness: There is no abdominal tenderness. There is no right CVA tenderness, left CVA tenderness, guarding or rebound.  Musculoskeletal:        General: Normal range of motion.     Cervical back: Normal range of motion.     Right lower leg: No edema.     Left lower leg: No edema.  Lymphadenopathy:     Cervical: No cervical adenopathy.  Skin:    General: Skin is  warm and dry.     Capillary Refill: Capillary refill takes less than 2 seconds.  Neurological:     General: No focal deficit present.     Mental Status: She is alert and oriented to person, place, and time. Mental status is at baseline.     Motor: No weakness.  Psychiatric:        Mood and Affect: Mood normal.        Behavior: Behavior normal.        Thought Content: Thought content normal.        Judgment: Judgment normal.     ASSESSMENT/PLAN:  Osteoporosis, unspecified osteoporosis type, unspecified pathological fracture presence -     VITAMIN D 25 Hydroxy (Vit-D Deficiency, Fractures) -     Calcium -     Denosumab  Hyperlipidemia, unspecified hyperlipidemia type -     Lipid panel; Future  Abnormal thyroid function test -     TSH; Future  Acute cystitis without hematuria  Annual physical exam  Benign essential HTN -     TSH; Future -     Vitamin B12; Future   PDMP reviewed  No follow-ups on file.  Carollee Leitz, MD

## 2022-06-19 ENCOUNTER — Encounter: Payer: Self-pay | Admitting: Family Medicine

## 2022-06-19 NOTE — Assessment & Plan Note (Signed)
Recommend she follow-up with endocrinology for discussion iodine take that was previously ordered. Will repeat TSH today.

## 2022-06-19 NOTE — Assessment & Plan Note (Signed)
Doing well.  No recent fractures Bone density ordered today Continue Prolia 60 mg injectable every 6 month

## 2022-06-19 NOTE — Assessment & Plan Note (Signed)
Is a 66 year old female doing well. Annual labs today DEXA scan ordered MRI for mammogram, patient [place]. Colonoscopy referral Declined hepatitis C screening Tdap up-to-date Pneumonia up-to-date Follow-up in 1 year for repeat annual

## 2022-06-21 ENCOUNTER — Encounter: Payer: Self-pay | Admitting: Family Medicine

## 2022-06-21 NOTE — Addendum Note (Signed)
Addended by: Lanice Shirts on: 06/21/2022 06:20 PM   Modules accepted: Orders

## 2022-06-27 ENCOUNTER — Telehealth: Payer: Self-pay

## 2022-06-27 NOTE — Telephone Encounter (Signed)
Left message to call office back for lab results

## 2022-06-29 NOTE — Progress Notes (Signed)
Noted  

## 2022-08-02 DIAGNOSIS — Z1211 Encounter for screening for malignant neoplasm of colon: Secondary | ICD-10-CM | POA: Diagnosis not present

## 2022-08-09 DIAGNOSIS — E0590 Thyrotoxicosis, unspecified without thyrotoxic crisis or storm: Secondary | ICD-10-CM | POA: Diagnosis not present

## 2022-08-09 DIAGNOSIS — E049 Nontoxic goiter, unspecified: Secondary | ICD-10-CM | POA: Diagnosis not present

## 2022-08-10 LAB — COLOGUARD: COLOGUARD: NEGATIVE

## 2022-08-17 ENCOUNTER — Other Ambulatory Visit: Payer: Self-pay | Admitting: Endocrinology

## 2022-08-17 DIAGNOSIS — E0590 Thyrotoxicosis, unspecified without thyrotoxic crisis or storm: Secondary | ICD-10-CM | POA: Diagnosis not present

## 2022-08-17 DIAGNOSIS — E785 Hyperlipidemia, unspecified: Secondary | ICD-10-CM | POA: Diagnosis not present

## 2022-08-17 DIAGNOSIS — M810 Age-related osteoporosis without current pathological fracture: Secondary | ICD-10-CM | POA: Diagnosis not present

## 2022-08-17 DIAGNOSIS — E049 Nontoxic goiter, unspecified: Secondary | ICD-10-CM

## 2022-08-18 ENCOUNTER — Inpatient Hospital Stay
Admission: RE | Admit: 2022-08-18 | Discharge: 2022-08-18 | Disposition: A | Discharge status: Home / Self Care | Payer: Self-pay | Source: Ambulatory Visit | Attending: *Deleted | Admitting: *Deleted

## 2022-08-18 ENCOUNTER — Other Ambulatory Visit: Payer: Self-pay | Admitting: *Deleted

## 2022-08-18 DIAGNOSIS — Z1231 Encounter for screening mammogram for malignant neoplasm of breast: Secondary | ICD-10-CM

## 2022-08-24 ENCOUNTER — Ambulatory Visit
Admission: RE | Admit: 2022-08-24 | Discharge: 2022-08-24 | Disposition: A | Discharge status: Home / Self Care | Payer: BC Managed Care – PPO | Source: Ambulatory Visit | Attending: Endocrinology | Admitting: Endocrinology

## 2022-08-24 ENCOUNTER — Encounter
Admission: RE | Admit: 2022-08-24 | Discharge: 2022-08-24 | Disposition: A | Discharge status: Home / Self Care | Payer: BC Managed Care – PPO | Source: Ambulatory Visit | Attending: Endocrinology | Admitting: Endocrinology

## 2022-08-24 ENCOUNTER — Other Ambulatory Visit: Payer: Self-pay | Admitting: Internal Medicine

## 2022-08-24 DIAGNOSIS — E042 Nontoxic multinodular goiter: Secondary | ICD-10-CM | POA: Diagnosis not present

## 2022-08-24 DIAGNOSIS — E0590 Thyrotoxicosis, unspecified without thyrotoxic crisis or storm: Secondary | ICD-10-CM | POA: Diagnosis not present

## 2022-08-24 DIAGNOSIS — E049 Nontoxic goiter, unspecified: Secondary | ICD-10-CM | POA: Insufficient documentation

## 2022-08-24 DIAGNOSIS — Z853 Personal history of malignant neoplasm of breast: Secondary | ICD-10-CM

## 2022-08-24 MED ORDER — SODIUM IODIDE I-123 7.4 MBQ CAPS
329.0000 | ORAL_CAPSULE | Freq: Once | ORAL | Status: AC
  Administered 2022-08-24: 329 via ORAL

## 2022-08-25 ENCOUNTER — Ambulatory Visit
Admission: RE | Admit: 2022-08-25 | Discharge: 2022-08-25 | Disposition: A | Discharge status: Home / Self Care | Payer: BC Managed Care – PPO | Source: Ambulatory Visit | Attending: Endocrinology | Admitting: Endocrinology

## 2022-08-25 DIAGNOSIS — E0590 Thyrotoxicosis, unspecified without thyrotoxic crisis or storm: Secondary | ICD-10-CM | POA: Diagnosis not present

## 2022-08-25 DIAGNOSIS — E010 Iodine-deficiency related diffuse (endemic) goiter: Secondary | ICD-10-CM | POA: Diagnosis not present

## 2022-08-29 ENCOUNTER — Other Ambulatory Visit: Payer: Self-pay | Admitting: Endocrinology

## 2022-08-29 DIAGNOSIS — E049 Nontoxic goiter, unspecified: Secondary | ICD-10-CM

## 2022-08-30 DIAGNOSIS — D320 Benign neoplasm of cerebral meninges: Secondary | ICD-10-CM | POA: Diagnosis not present

## 2022-08-30 DIAGNOSIS — Z853 Personal history of malignant neoplasm of breast: Secondary | ICD-10-CM | POA: Diagnosis not present

## 2022-08-30 DIAGNOSIS — D329 Benign neoplasm of meninges, unspecified: Secondary | ICD-10-CM | POA: Diagnosis not present

## 2022-09-04 ENCOUNTER — Encounter: Payer: Self-pay | Admitting: Family Medicine

## 2022-09-05 ENCOUNTER — Other Ambulatory Visit: Payer: Self-pay

## 2022-09-05 ENCOUNTER — Ambulatory Visit
Admission: RE | Admit: 2022-09-05 | Discharge: 2022-09-05 | Disposition: A | Discharge status: Home / Self Care | Payer: BC Managed Care – PPO | Source: Ambulatory Visit | Attending: Internal Medicine | Admitting: Internal Medicine

## 2022-09-05 DIAGNOSIS — E785 Hyperlipidemia, unspecified: Secondary | ICD-10-CM

## 2022-09-05 DIAGNOSIS — I10 Essential (primary) hypertension: Secondary | ICD-10-CM

## 2022-09-12 ENCOUNTER — Ambulatory Visit: Payer: BC Managed Care – PPO | Admitting: Family Medicine

## 2022-09-13 ENCOUNTER — Other Ambulatory Visit (HOSPITAL_COMMUNITY)
Admission: RE | Admit: 2022-09-13 | Discharge: 2022-09-13 | Disposition: A | Discharge status: Home / Self Care | Payer: BC Managed Care – PPO | Source: Ambulatory Visit | Attending: Endocrinology | Admitting: Endocrinology

## 2022-09-13 ENCOUNTER — Ambulatory Visit
Admission: RE | Admit: 2022-09-13 | Discharge: 2022-09-13 | Disposition: A | Discharge status: Home / Self Care | Payer: BC Managed Care – PPO | Source: Ambulatory Visit | Attending: Endocrinology | Admitting: Endocrinology

## 2022-09-13 DIAGNOSIS — E049 Nontoxic goiter, unspecified: Secondary | ICD-10-CM

## 2022-09-13 DIAGNOSIS — E041 Nontoxic single thyroid nodule: Secondary | ICD-10-CM | POA: Diagnosis not present

## 2022-09-15 ENCOUNTER — Other Ambulatory Visit: Payer: Self-pay | Admitting: Endocrinology

## 2022-09-15 ENCOUNTER — Ambulatory Visit
Admission: RE | Admit: 2022-09-15 | Discharge: 2022-09-15 | Disposition: A | Discharge status: Home / Self Care | Payer: BC Managed Care – PPO | Source: Ambulatory Visit | Attending: Family Medicine | Admitting: Family Medicine

## 2022-09-15 DIAGNOSIS — E785 Hyperlipidemia, unspecified: Secondary | ICD-10-CM | POA: Insufficient documentation

## 2022-09-15 DIAGNOSIS — E0590 Thyrotoxicosis, unspecified without thyrotoxic crisis or storm: Secondary | ICD-10-CM

## 2022-09-15 DIAGNOSIS — I10 Essential (primary) hypertension: Secondary | ICD-10-CM | POA: Insufficient documentation

## 2022-09-15 LAB — CYTOLOGY - NON PAP

## 2022-09-15 NOTE — Written Directive (Addendum)
MOLECULAR IMAGING AND THERAPEUTICS WRITTEN DIRECTIVE   PATIENT NAME: Tanya Olson  PT DOB:   [birth year]                                              MRN: [id number]  ---------------------------------------------------------------------------------------------------------------------   I-131 WHOLE THYROID THERAPY (NON-CANCER)    RADIOPHARMACEUTICAL:   Iodine-131 Capsule    PRESCRIBED DOSE FOR ADMINISTRATION: 30 mCi  ROUTE OFADMINISTRATION: PO   DIAGNOSIS:  toxic multinodular goiter   REFERRING PHYSICIAN: Dorisann Frames, MD, [facility]  [phone]  Fax: [phone]   TSH:    Lab Results  Component Value Date   TSH 0.15 (L) 06/08/2022   TSH 0.29 (L) 06/07/2021   TSH 0.32 (L) 01/28/2019     PRIOR I-131 THERAPY (Date and Dose):   PRIOR RADIOLOGY EXAMS (Results and Date): NM THYROID MULT UPTAKE W/IMAGING  Result Date: 08/25/2022 CLINICAL DATA:  Subclinical hyperthyroidism, TSH 0.15 EXAM: THYROID SCAN AND UPTAKE - 4 AND 24 HOURS TECHNIQUE: Following oral administration of I-123 capsule, anterior planar imaging was acquired at 24 hours. Thyroid uptake was calculated with a thyroid probe at 4-6 hours and 24 hours. RADIOPHARMACEUTICALS:  Three hundred twenty-nine uCi I-123 sodium iodide p.o. COMPARISON:  08/24/2022, 06/14/2021 FINDINGS: Planar imaging of the thyroid demonstrates thyromegaly with heterogeneous background radiotracer uptake consistent with known multinodular goiter. There is photopenia associated with the dominant solid nodule within the lower pole left lobe thyroid, for which ultrasound-guided tissue sampling was previously recommended. 4 hour I-123 uptake = 8.2% (normal 5-20%) 24 hour I-123 uptake = 17.6% (normal 10-30%) IMPRESSION: 1. Cold nodule corresponding to the dominant 3.1 cm lower pole left lobe thyroid nodule seen on recent ultrasound exam. Ultrasound-guided tissue sampling is again recommended if not previously performed. 2. Heterogeneous  radiotracer uptake throughout the remainder of the enlarged thyroid, consistent with known multinodular goiter. 3. Normal iodine uptake values. Electronically Signed   By: Sharlet Salina M.D.   On: 08/25/2022 20:52   US THYROID  Result Date: 08/24/2022 CLINICAL DATA:  Prior ultrasound follow-up. EXAM: THYROID ULTRASOUND TECHNIQUE: Ultrasound examination of the thyroid gland and adjacent soft tissues was performed. COMPARISON:  06/14/2021 FINDINGS: Parenchymal Echotexture: Moderately heterogeneous Isthmus: 0.4 cm ,previously 0.3 cm Right lobe: 5.9 x 2.8 x 2.3 cm ,previously 5.7 x 2.4 x 2.4 cm Left lobe: 6.9 x 3.6 x 3.2 cm ,previously 6.2 x 3.8 x 3.2 cm ________________________________________________________ Estimated total number of nodules >/= 1 cm: 5 Number of spongiform nodules >/=  2 cm not described below (TR1): 0 Number of mixed cystic and solid nodules >/= 1.5 cm not described below (TR2): 0 _________________________________________________________ Again seen are 4 discrete benign-appearing (TI-RADS category 2-3), mostly spongiform or solid cystic nodules in the right hemi thyroid, each globally decreased in size from comparison, and again appearing benign. Decreased size of previously visualized benign-appearing solid cystic nodule in the left superior thyroid (labeled 5, 1.8 cm, previously 2.1 cm). Decreased size with interval cystic degeneration of previously visualized, benign-appearing left mid solid thyroid nodule (labeled 6, 1.8 cm, previously 2.2 cm). Nodule # 7: Prior biopsy: No Location: Left; Inferior Maximum size: 3.1 cm; Other 2 dimensions: 2.6 x 2.7 cm, previously, 3.4 x 2.7 x 2.5 cm Composition: solid/almost completely solid (2) Echogenicity: isoechoic (1) Shape: taller-than-wide (3) Margins: smooth (0) Echogenic foci: none (0) ACR TI-RADS total points: 6. ACR TI-RADS risk category:  TR4 (4-6 points). Significant change in size (>/= 20% in two dimensions and minimal increase of 2 mm): No  Change in features: No Change in ACR TI-RADS risk category: No ACR TI-RADS recommendations: **Given size (>/= 1.5 cm) and appearance, fine needle aspiration of this moderately suspicious nodule should be considered based on TI-RADS criteria. _________________________________________________________ The previously described left inferior solid nodule labeled 8 is no longer conspicuous. No cervical lymphadenopathy. IMPRESSION: 1. Similar appearing multinodular goiter. 2. Similar appearance of previously visualized left inferior solid thyroid nodule (labeled 7, 3.1 cm, previously 3.4 cm). This nodule meets criteria (TI-RADS category 4) for tissue sampling. Recommend ultrasound-guided fine-needle aspiration if not already obtained. 3. The previously described left inferior solid nodule (labeled 8) is no longer conspicuous. 4. Similar to decreased size of the additional previously visualized benign-appearing bilateral thyroid nodules. These do not require additional follow-up. The above is in keeping with the ACR TI-RADS recommendations - Lile Mccurley Am Coll Radiol 2017;14:587-595. Marliss Coots, MD [facility] Electronically Signed   By: Marliss Coots M.D.   On: 08/24/2022 09:37   US THYROID  Result Date: 06/14/2021 CLINICAL DATA:  Other. Abnormal thyroid function. History of previous thyroid nodule fine-needle aspiration performed at an outside institution. EXAM: THYROID ULTRASOUND TECHNIQUE: Ultrasound examination of the thyroid gland and adjacent soft tissues was performed. COMPARISON:  None. FINDINGS: Parenchymal Echotexture: Moderately heterogenous Isthmus: Normal in size measuring 0.3 cm in diameter Right lobe: Borderline enlarged measuring 5.7 x 2.4 x 2.4 cm Left lobe: Enlarged measuring 6.2 x 3.8 x 3.2 cm _________________________________________________________ Estimated total number of nodules >/= 1 cm: 6-10 Number of spongiform nodules >/=  2 cm not  described below (TR1): 0 Number of mixed cystic and solid nodules >/= 1.5 cm not described below (TR2): 0 _________________________________________________________ Ronnald Nian approximally 1.6 x 1.3 x 1.1 cm isoechoic ill-defined nodule/pseudonodule within the superior pole of the right lobe of the thyroid (labeled 1), is favored to represent a pseudonodule as it lacks define borders of both about a transverse and sagittal images and as supported by the provided cine series. _________________________________________________________ There are 2 punctate (sub 1.0 cm) spongiform/benign-appearing nodules within the mid aspect of the right lobe of the thyroid (labeled 2 and 3), neither of which meet imaging criteria to recommend percutaneous sampling or continued dedicated follow-up. _________________________________________________________ Questioned 2.6 x 2.1 x 1.4 cm isoechoic ill-defined nodule within the inferior pole of the right lobe of the thyroid (labeled 4) is favored to represent a pseudonodule as it lacks define borders on both provided transverse and sagittal images, as supported by provided cine series. _________________________________________________________ There is an approximately 1.0 cm minimally complex cyst within the superior pole of the left lobe of the thyroid (labeled 5), which does not meet criteria to recommend percutaneous sampling or continued dedicated follow-up. There is a punctate (approximally 0.7 cm) spongiform/benign-appearing nodule within the superior pole of the left lobe of the thyroid which does not meet criteria to recommend percutaneous sampling or continued dedicated follow-up. There is an approximately 2.2 x 2.1 x 1.2 cm spongiform/benign-appearing partially cystic nodule within the mid aspect of the left lobe of the thyroid (labeled 6), which does not meet criteria to recommend percutaneous sampling or continued dedicated follow-up.  _________________________________________________________ Nodule # 7: Location: Left; Inferior Maximum size: 3.4 cm; Other 2 dimensions: 2.7 x 2.5 cm Composition: solid/almost completely solid (2) Echogenicity: isoechoic (1) Shape: taller-than-wide (3) Margins: ill-defined (0) Echogenic foci: none (0) ACR TI-RADS total points: 6. ACR  TI-RADS risk category: TR4 (4-6 points). ACR TI-RADS recommendations: **Given size (>/= 1.5 cm) and appearance, fine needle aspiration of this moderately suspicious nodule should be considered based on TI-RADS criteria. _________________________________________________________ Nodule # 8: Location: Left; Inferior Maximum size: 2.2 cm; Other 2 dimensions: 1.7 x 1.3 cm Composition: solid/almost completely solid (2) Echogenicity: isoechoic (1) Shape: not taller-than-wide (0) Margins: ill-defined (0) Echogenic foci: none (0) ACR TI-RADS total points: 3. ACR TI-RADS risk category: TR3 (3 points). ACR TI-RADS recommendations: *Given size (>/= 1.5 - 2.4 cm) and appearance, a follow-up ultrasound in 1 year should be considered based on TI-RADS criteria. _________________________________________________________ IMPRESSION: 1. Thyromegaly with findings suggestive of multinodular goiter. 2. Nodule labeled #7 meets imaging criteria to recommend percutaneous sampling as indicated. 3. Nodule labeled #8 meets imaging criteria to recommend a 1 year follow-up. The above is in keeping with the ACR TI-RADS recommendations - Marshella Tello Am Coll Radiol 2017;14:587-595. Electronically Signed   By: Simonne Come M.D.   On: 06/14/2021 16:15      ADDITIONAL PHYSICIAN COMMENTS/NOTES Toxic MNG.. 30 mCi  I 131  AUTHORIZED USER SIGNATURE & TIME STAMP: Patriciaann Clan, MD   09/21/22    3:28 PM

## 2022-09-17 ENCOUNTER — Encounter: Payer: Self-pay | Admitting: Family Medicine

## 2022-09-18 ENCOUNTER — Encounter: Payer: Self-pay | Admitting: Family Medicine

## 2022-09-18 DIAGNOSIS — I700 Atherosclerosis of aorta: Secondary | ICD-10-CM | POA: Insufficient documentation

## 2022-10-12 ENCOUNTER — Ambulatory Visit: Payer: BC Managed Care – PPO | Admitting: Family Medicine

## 2022-10-29 ENCOUNTER — Telehealth: Payer: Self-pay | Admitting: *Deleted

## 2022-10-29 NOTE — Telephone Encounter (Signed)
$  0 due; PA Required for Prolia Injection  Pt scheduled on 12/07/22  **Sent to PA team**

## 2022-11-08 NOTE — Telephone Encounter (Signed)
Second PA request for Prolia- Appt scheduled on 12/07/22

## 2022-11-09 NOTE — Telephone Encounter (Signed)
Noted thanks °

## 2022-11-09 NOTE — Telephone Encounter (Signed)
Pharmacy Patient Advocate Encounter   Received notification from [employer] that prior authorization for Prolia is required/requested.   PA submitted on 11/09/22 to (ins) [employer] via [facility] portal Key  # [id number] Status is pending

## 2022-11-14 NOTE — Telephone Encounter (Signed)
Hello. Requesting update on PA, thanks

## 2022-11-15 ENCOUNTER — Inpatient Hospital Stay
Admission: RE | Admit: 2022-11-15 | Discharge: 2022-11-15 | Disposition: A | Discharge status: Home / Self Care | Payer: BC Managed Care – PPO | Source: Ambulatory Visit | Attending: Endocrinology | Admitting: Endocrinology

## 2022-11-16 ENCOUNTER — Ambulatory Visit: Payer: BC Managed Care – PPO

## 2022-11-17 NOTE — Telephone Encounter (Signed)
Received notification via fax for additional information for prior authorization for Prolia.   Placed a call to [phone] and spoke with Baxter Hire to give information. Stated turn around time would be 72 hours and she was submitting it for review.   Will update once determination is received.

## 2022-11-21 ENCOUNTER — Other Ambulatory Visit (HOSPITAL_COMMUNITY): Payer: Self-pay

## 2022-11-21 NOTE — Telephone Encounter (Signed)
Pt ready for scheduling for Prolia on or after : 12/07/22  Out-of-pocket cost due at time of visit: $0  Primary: [employer] [place] - Commercial Prolia co-insurance: 100% Admin fee co-insurance: 100% (after OOP max is met)  Secondary: N/A Prolia co-insurance:  Admin fee co-insurance:   Medical Benefit Details: Date Benefits were checked: 10/18/22 Deductible: no/ Coinsurance: 100%/ Admin Fee: 100% (after OOP max is met)  Prior Auth: approved PA# [id number] Expiration Date: 11/09/22 to 11/09/23   Pharmacy benefit: Copay $not covered - insurance prefers buy and bill  If patient wants fill through the pharmacy benefit please send prescription to:  [facility] , and include estimated need by date in rx notes. Pharmacy will ship medication directly to the office.  Patient may be eligible for Prolia Copay Card. Copay Card can make patient's cost as little as $25. Link to apply: [email or web address]://www.amgensupportplus.[email or web address]/copay  ** This summary of benefits is an estimation of the patient's out-of-pocket cost. Exact cost may very based on individual plan coverage.

## 2022-11-23 NOTE — Telephone Encounter (Signed)
Noted pt scheduled on 12/07/22

## 2022-12-07 ENCOUNTER — Ambulatory Visit (INDEPENDENT_AMBULATORY_CARE_PROVIDER_SITE_OTHER): Payer: BC Managed Care – PPO

## 2022-12-07 DIAGNOSIS — M810 Age-related osteoporosis without current pathological fracture: Secondary | ICD-10-CM | POA: Diagnosis not present

## 2022-12-07 MED ORDER — DENOSUMAB 60 MG/ML ~~LOC~~ SOSY
60.0000 mg | PREFILLED_SYRINGE | Freq: Once | SUBCUTANEOUS | Status: AC
  Administered 2022-12-07: 60 mg via SUBCUTANEOUS

## 2022-12-07 NOTE — Progress Notes (Signed)
Patient arrived for a Prolia injection and it was administered into her right arm [place]. Patient tolerated the injection well and did not show any signs of distress or voice any concerns.

## 2023-02-19 IMAGING — US US THYROID
1 series · 12 of 25 positions shown · non-contrast
Comparison: None.

CLINICAL DATA: Other. Abnormal thyroid function. History of
previous thyroid nodule fine-needle aspiration performed at an
outside institution.

EXAM:
THYROID ULTRASOUND
TECHNIQUE: Ultrasound examination of the thyroid gland and adjacent soft
tissues was performed.

[Series 1: us thyroid · 0.07mm/px · 12 of 112 slices shown]
[im 5/112]
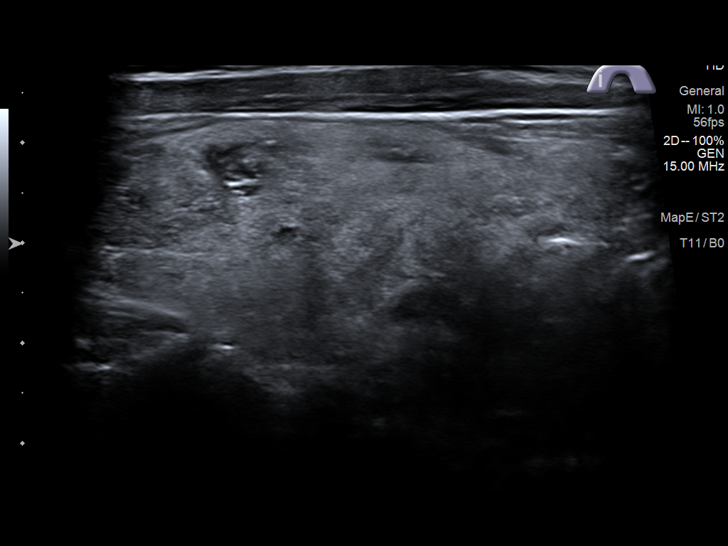
[im 14/112]
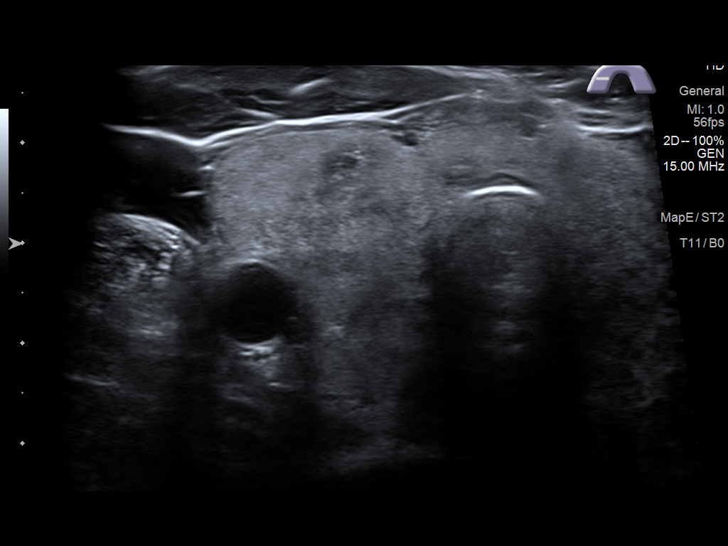
[im 24/112]
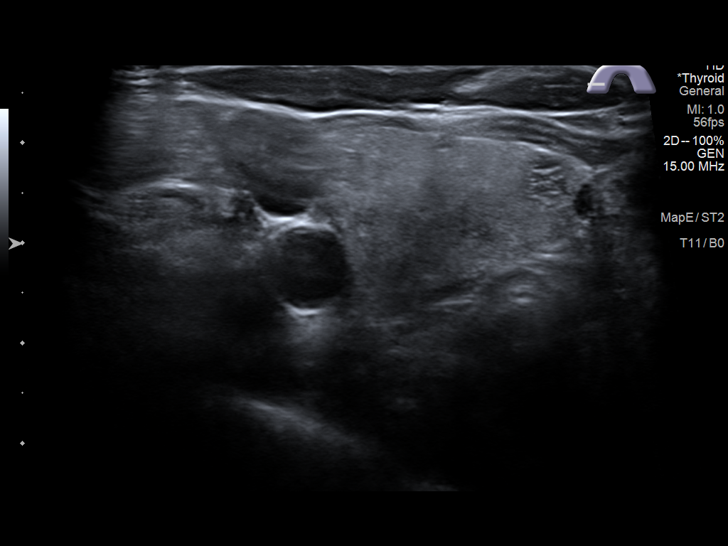
[im 33/112]
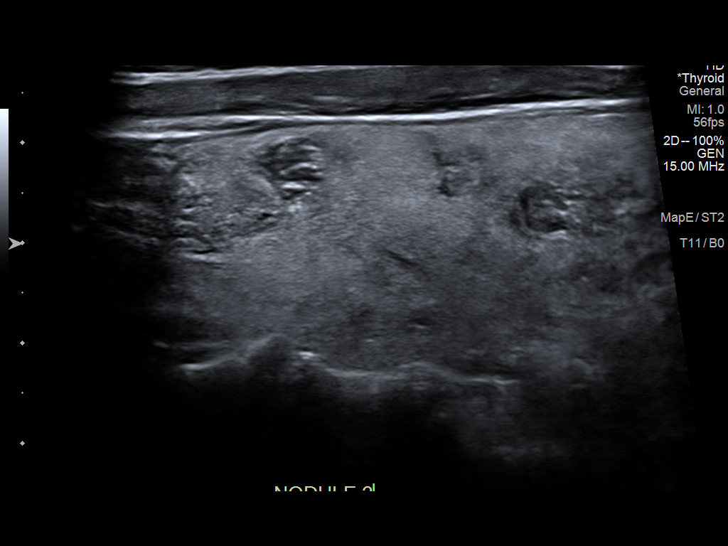
[im 42/112]
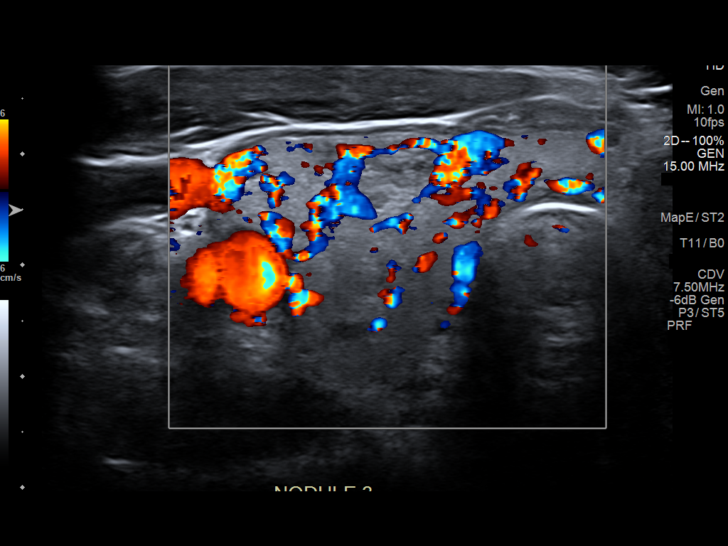
[im 51/112]
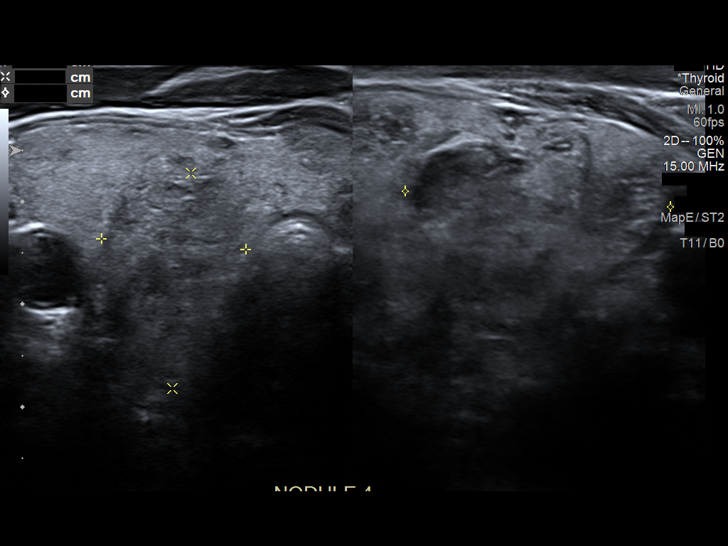
[im 61/112]
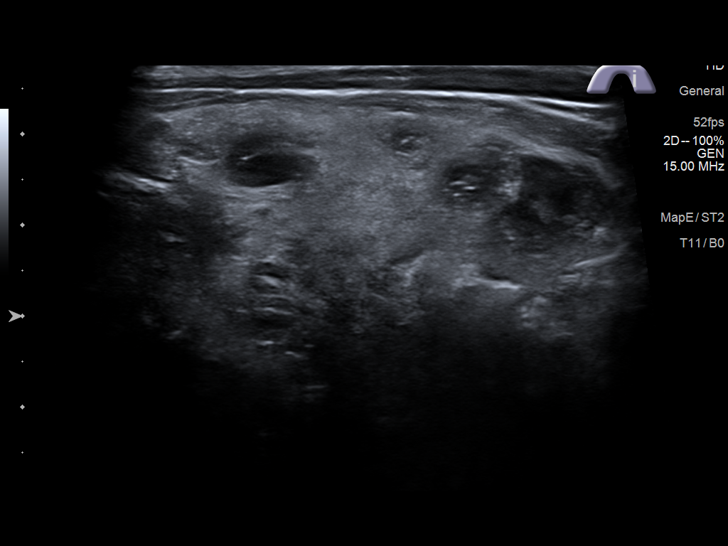
[im 70/112]
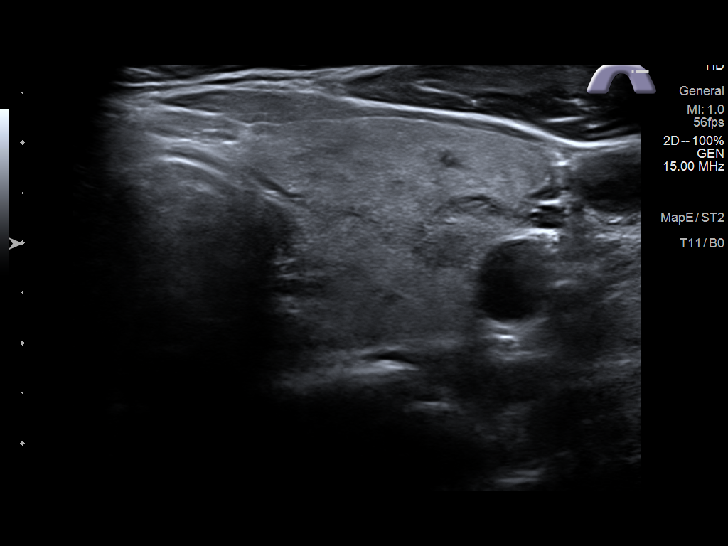
[im 79/112]
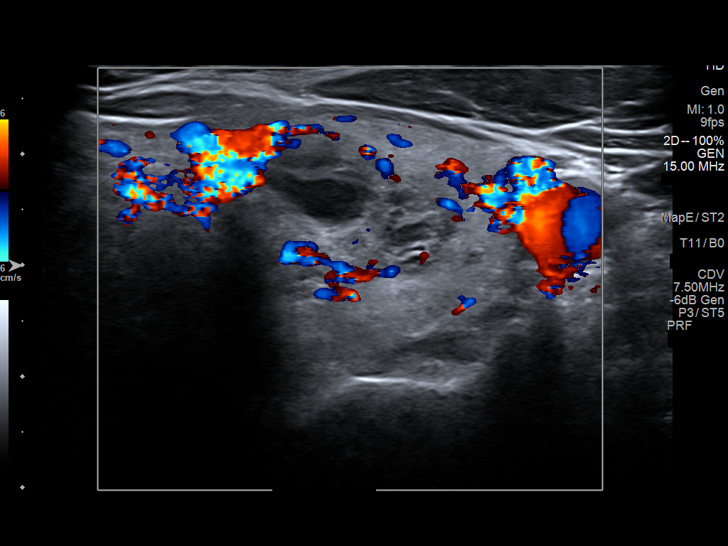
[im 88/112]
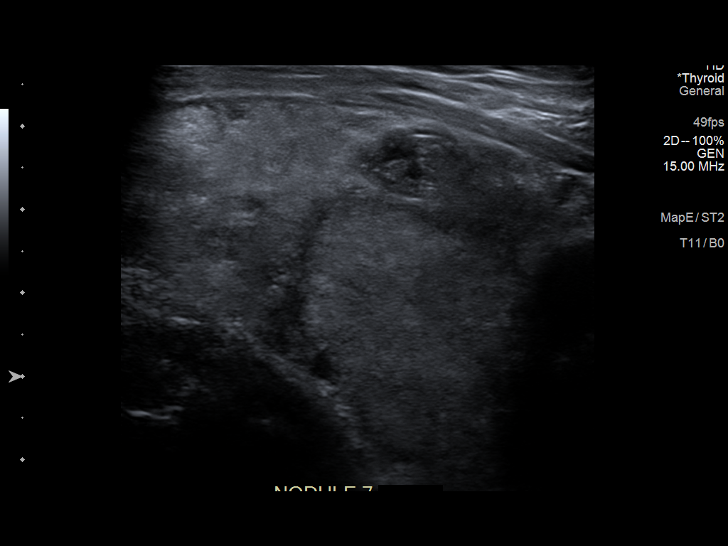
[im 98/112]
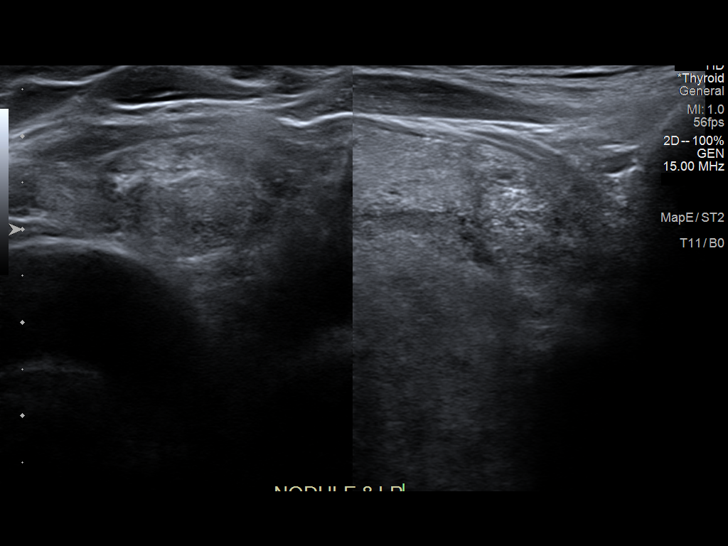
[im 107/112]
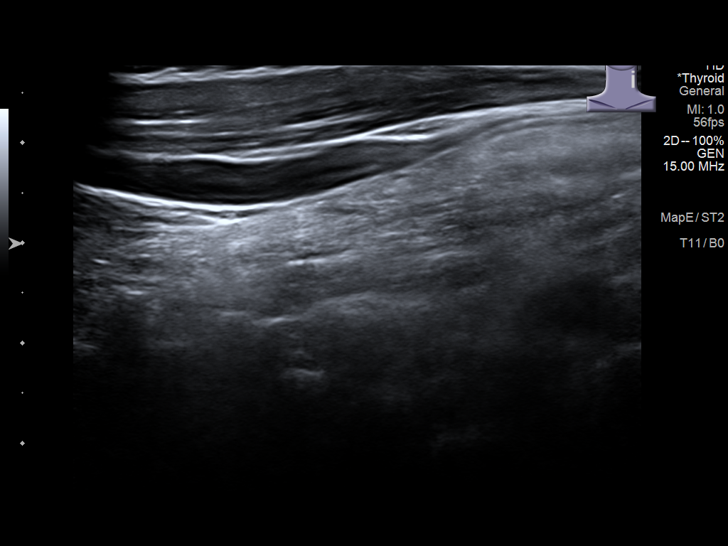

[12 of 25 positions shown; findings below may reference images not displayed]

FINDINGS: Parenchymal Echotexture: Moderately heterogenous

Isthmus: Normal in size measuring 0.3 cm in diameter

Right lobe: Borderline enlarged measuring 5.7 x 2.4 x 2.4 cm

Left lobe: Enlarged measuring 6.2 x 3.8 x 3.2 cm

_________________________________________________________

Estimated total number of nodules >/= 1 cm: 6-10

Number of spongiform nodules >/=  2 cm not described below (TR1): 0

Number of mixed cystic and solid nodules >/= 1.5 cm not described
below (TR2): 0

_________________________________________________________

Questioned approximally 1.6 x 1.3 x 1.1 cm isoechoic ill-defined
nodule/pseudonodule within the superior pole of the right lobe of
the thyroid (labeled 1), is favored to represent a pseudonodule as
it lacks define borders of both about a transverse and sagittal
images and as supported by the provided cine series.

_________________________________________________________

There are 2 punctate (sub 1.0 cm) spongiform/benign-appearing
nodules within the mid aspect of the right lobe of the thyroid
(labeled 2 and 3), neither of which meet imaging criteria to
recommend percutaneous sampling or continued dedicated follow-up.

_________________________________________________________

Questioned 2.6 x 2.1 x 1.4 cm isoechoic ill-defined nodule within
the inferior pole of the right lobe of the thyroid (labeled 4) is
favored to represent a pseudonodule as it lacks define borders on
both provided transverse and sagittal images, as supported by
provided cine series.

_________________________________________________________

There is an approximately 1.0 cm minimally complex cyst within the
superior pole of the left lobe of the thyroid (labeled 5), which
does not meet criteria to recommend percutaneous sampling or
continued dedicated follow-up.

There is a punctate (approximally 0.7 cm)
spongiform/benign-appearing nodule within the superior pole of the
left lobe of the thyroid which does not meet criteria to recommend
percutaneous sampling or continued dedicated follow-up.

There is an approximately 2.2 x 2.1 x 1.2 cm
spongiform/benign-appearing partially cystic nodule within the mid
aspect of the left lobe of the thyroid (labeled 6), which does not
meet criteria to recommend percutaneous sampling or continued
dedicated follow-up.

_________________________________________________________

Nodule # 7:

Location: Left; Inferior

Maximum size: 3.4 cm; Other 2 dimensions: 2.7 x 2.5 cm

Composition: solid/almost completely solid (2)

Echogenicity: isoechoic (1)

Shape: taller-than-wide (3)

Margins: ill-defined (0)

Echogenic foci: none (0)

ACR TI-RADS total points: 6.

ACR TI-RADS risk category: TR4 (4-6 points).

ACR TI-RADS recommendations:

**Given size (>/= 1.5 cm) and appearance, fine needle aspiration of
this moderately suspicious nodule should be considered based on
TI-RADS criteria.

_________________________________________________________

Nodule # 8:

Location: Left; Inferior

Maximum size: 2.2 cm; Other 2 dimensions: 1.7 x 1.3 cm

Composition: solid/almost completely solid (2)

Echogenicity: isoechoic (1)

Shape: not taller-than-wide (0)

Margins: ill-defined (0)

Echogenic foci: none (0)

ACR TI-RADS total points: 3.

ACR TI-RADS risk category: TR3 (3 points).

ACR TI-RADS recommendations:

*Given size (>/= 1.5 - 2.4 cm) and appearance, a follow-up
ultrasound in 1 year should be considered based on TI-RADS criteria.

_________________________________________________________
IMPRESSION: 1. Thyromegaly with findings suggestive of multinodular goiter.
2. Nodule labeled #7 meets imaging criteria to recommend
percutaneous sampling as indicated.
3. Nodule labeled #8 meets imaging criteria to recommend a 1 year
follow-up.

The above is in keeping with the ACR TI-RADS recommendations - [HOSPITAL] 9597;[DATE].

## 2023-05-02 ENCOUNTER — Telehealth: Payer: Self-pay | Admitting: *Deleted

## 2023-05-02 NOTE — Telephone Encounter (Signed)
$  0 due; PA on file-good until 11/09/23 Sent mychart message  [id number]

## 2023-06-06 DIAGNOSIS — Z853 Personal history of malignant neoplasm of breast: Secondary | ICD-10-CM | POA: Diagnosis not present

## 2023-06-06 DIAGNOSIS — Z170 Estrogen receptor positive status [ER+]: Secondary | ICD-10-CM | POA: Diagnosis not present

## 2023-06-11 ENCOUNTER — Ambulatory Visit: Payer: BC Managed Care – PPO | Admitting: Family Medicine

## 2023-06-11 ENCOUNTER — Encounter: Payer: Self-pay | Admitting: Family Medicine

## 2023-06-11 VITALS — BP 124/76 | HR 74 | Temp 98.0°F | Resp 16 | Ht 60.0 in | Wt 125.1 lb

## 2023-06-11 DIAGNOSIS — E042 Nontoxic multinodular goiter: Secondary | ICD-10-CM | POA: Diagnosis not present

## 2023-06-11 DIAGNOSIS — I700 Atherosclerosis of aorta: Secondary | ICD-10-CM

## 2023-06-11 DIAGNOSIS — I10 Essential (primary) hypertension: Secondary | ICD-10-CM

## 2023-06-11 DIAGNOSIS — M810 Age-related osteoporosis without current pathological fracture: Secondary | ICD-10-CM

## 2023-06-11 DIAGNOSIS — E538 Deficiency of other specified B group vitamins: Secondary | ICD-10-CM | POA: Diagnosis not present

## 2023-06-11 DIAGNOSIS — Z0000 Encounter for general adult medical examination without abnormal findings: Secondary | ICD-10-CM

## 2023-06-11 LAB — COMPREHENSIVE METABOLIC PANEL
ALT: 17 U/L (ref 0–35)
AST: 22 U/L (ref 0–37)
Albumin: 4.5 g/dL (ref 3.5–5.2)
Alkaline Phosphatase: 61 U/L (ref 39–117)
BUN: 16 mg/dL (ref 6–23)
CO2: 29 meq/L (ref 19–32)
Calcium: 10.6 mg/dL — ABNORMAL HIGH (ref 8.4–10.5)
Chloride: 101 meq/L (ref 96–112)
Creatinine, Ser: 0.96 mg/dL (ref 0.40–1.20)
GFR: 61.15 mL/min (ref >60.00)
Glucose, Bld: 91 mg/dL (ref 70–99)
Potassium: 4.3 meq/L (ref 3.5–5.1)
Sodium: 139 meq/L (ref 135–145)
Total Bilirubin: 0.7 mg/dL (ref 0.2–1.2)
Total Protein: 7.4 g/dL (ref 6.0–8.3)

## 2023-06-11 LAB — LIPID PANEL
Cholesterol: 237 mg/dL — ABNORMAL HIGH (ref 0–200)
HDL: 55 mg/dL (ref >39.00)
LDL Cholesterol: 150 mg/dL — ABNORMAL HIGH (ref 0–99)
NonHDL: 182.43
Total CHOL/HDL Ratio: 4
Triglycerides: 162 mg/dL — ABNORMAL HIGH (ref 0.0–149.0)
VLDL: 32.4 mg/dL (ref 0.0–40.0)

## 2023-06-11 LAB — VITAMIN B12: Vitamin B-12: 358 pg/mL (ref 211–911)

## 2023-06-11 LAB — CBC
HCT: 45 % (ref 36.0–46.0)
Hemoglobin: 14.9 g/dL (ref 12.0–15.0)
MCHC: 33.1 g/dL (ref 30.0–36.0)
MCV: 92.7 fL (ref 78.0–100.0)
Platelets: 304 10*3/uL (ref 150.0–400.0)
RBC: 4.85 Mil/uL (ref 3.87–5.11)
RDW: 12.8 % (ref 11.5–15.5)
WBC: 6.2 10*3/uL (ref 4.0–10.5)

## 2023-06-11 LAB — VITAMIN D 25 HYDROXY (VIT D DEFICIENCY, FRACTURES): VITD: 44.33 ng/mL (ref 30.00–100.00)

## 2023-06-11 LAB — TSH: TSH: 0.17 u[IU]/mL — ABNORMAL LOW (ref 0.35–5.50)

## 2023-06-11 MED ORDER — LISINOPRIL 10 MG PO TABS: 10.0000 mg | ORAL_TABLET | Freq: Every day | ORAL | 3 refills | Status: DC

## 2023-06-11 MED ORDER — DENOSUMAB 60 MG/ML ~~LOC~~ SOSY: 60.0000 mg | PREFILLED_SYRINGE | Freq: Once | SUBCUTANEOUS | Status: DC

## 2023-06-11 MED ORDER — DENOSUMAB 60 MG/ML ~~LOC~~ SOSY
60.0000 mg | PREFILLED_SYRINGE | Freq: Once | SUBCUTANEOUS | Status: AC
  Administered 2023-06-11: 60 mg via SUBCUTANEOUS

## 2023-06-11 NOTE — Assessment & Plan Note (Signed)
Well controlled Refill Lisinopril 10 mg daily

## 2023-06-11 NOTE — Assessment & Plan Note (Signed)
Is a 67 year old female doing well. Annual labs today DEXA scan previously  MRI for mammogram, patient plans to have completed in [place] Colonoguard up to date Declined hepatitis C screening Tdap up-to-date Pneumonia up-to-date Flu up to date Annual labs today

## 2023-06-11 NOTE — Assessment & Plan Note (Signed)
Breast Implants  s/p mastectomy Patient has breast implants and is due for an MRI to assess her integrity. Patient plans to have this done in [place] with her Engineer, petroleum.   -No action needed at this time.

## 2023-06-11 NOTE — Assessment & Plan Note (Signed)
Patient has not had a DEXA scan.   -Provide patient with contact information to schedule DEXA scan. -Prolia injection administered today by CMA

## 2023-06-11 NOTE — Progress Notes (Signed)
Per orders of Dr. Dana Allan, injection of Prolia  given by [facility] in left arm. Patient tolerated injection well.

## 2023-06-11 NOTE — Progress Notes (Signed)
SUBJECTIVE:   Chief Complaint  Patient presents with   Annual Exam   HPI Presents to clinic for annual physical  Discussed the use of AI scribe software for clinical note transcription with the patient, who gave verbal consent to proceed.  History of Present Illness  The patient, with a history of thyroid issues, presents for a routine physical and to recheck thyroid levels. She reports that previous blood work, done in March of the previous year, showed near-normal thyroid levels, leading her to postpone a planned thyroid procedure. She expresses a desire to ensure that her thyroid levels are still trending favorably.  The patient also mentions a history of breast implants and plans to have an MRI done in [place] to assess the implants. She has completed a Cologuard test with good results and a cholesterol test, which showed zero calcium deposits, indicating no current risk of a major coronary artery event. However, she acknowledges the need for continued lifestyle modifications.  The patient reports a tendency towards constipation but denies any blood in her stool. She is generally active and tries to maintain a healthy diet. She also reports some difficulty with name recall but does not express concern about memory issues related to family or daily activities.  The patient is deaf in one ear but does not report any current hearing problems. She has received her flu, COVID, and pneumonia vaccines. She has not yet had a DEXA scan for osteoporosis but plans to schedule one. She denies any joint pain, fevers, chest pain, shortness of breath, belly pain, diarrhea, or blood in urine or stool.    PERTINENT PMH / PSH: As above  OBJECTIVE:  BP 124/76   Pulse 74   Temp 98 F (36.7 C)   Resp 16   Ht 5' (1.524 m)   Wt 125 lb 2 oz (56.8 kg)   SpO2 99%   BMI 24.44 kg/m    Physical Exam Vitals reviewed.  Constitutional:      General: She is not in acute distress.    Appearance: She  is not ill-appearing.  HENT:     Head: Normocephalic.     Right Ear: Tympanic membrane, ear canal and external ear normal.     Left Ear: Tympanic membrane, ear canal and external ear normal.     Nose: Nose normal.     Mouth/Throat:     Mouth: Mucous membranes are moist.  Eyes:     Extraocular Movements: Extraocular movements intact.     Conjunctiva/sclera: Conjunctivae normal.     Pupils: Pupils are equal, round, and reactive to light.  Neck:     Thyroid: No thyromegaly or thyroid tenderness.     Vascular: No carotid bruit.  Cardiovascular:     Rate and Rhythm: Normal rate and regular rhythm.     Pulses: Normal pulses.     Heart sounds: Normal heart sounds.  Pulmonary:     Effort: Pulmonary effort is normal.     Breath sounds: Normal breath sounds.  Abdominal:     General: Bowel sounds are normal. There is no distension.     Palpations: Abdomen is soft.     Tenderness: There is no abdominal tenderness. There is no right CVA tenderness, left CVA tenderness, guarding or rebound.  Musculoskeletal:        General: Normal range of motion.     Cervical back: Normal range of motion.     Right lower leg: No edema.     Left  lower leg: No edema.  Lymphadenopathy:     Cervical: No cervical adenopathy.  Skin:    Capillary Refill: Capillary refill takes less than 2 seconds.  Neurological:     General: No focal deficit present.     Mental Status: She is alert and oriented to person, place, and time. Mental status is at baseline.     Motor: No weakness.  Psychiatric:        Mood and Affect: Mood normal.        Behavior: Behavior normal.        Thought Content: Thought content normal.        Judgment: Judgment normal.        06/11/2023   11:30 AM 06/08/2022   11:15 AM 03/21/2022    1:20 PM 01/07/2021    9:49 AM 01/08/2019    3:26 PM  Depression screen PHQ 2/9  Decreased Interest 0 0 0 0 0  Down, Depressed, Hopeless 0 0 0 0 0  PHQ - 2 Score 0 0 0 0 0  Altered sleeping 0       Tired, decreased energy 0      Change in appetite 0      Feeling bad or failure about yourself  0      Trouble concentrating 0      Moving slowly or fidgety/restless 0      Suicidal thoughts 0      PHQ-9 Score 0      Difficult doing work/chores Not difficult at all          06/11/2023   11:31 AM  GAD 7 : Generalized Anxiety Score  Nervous, Anxious, on Edge 0  Control/stop worrying 0  Worry too much - different things 0  Trouble relaxing 0  Restless 0  Easily annoyed or irritable 0  Afraid - awful might happen 0  Total GAD 7 Score 0  Anxiety Difficulty Not difficult at all    ASSESSMENT/PLAN:  Annual physical exam Assessment & Plan: Is a 67 year old female doing well. Annual labs today DEXA scan previously  MRI for mammogram, patient plans to have completed in [place] Colonoguard up to date Declined hepatitis C screening Tdap up-to-date Pneumonia up-to-date Flu up to date Annual labs today     Osteoporosis, unspecified osteoporosis type, unspecified pathological fracture presence Assessment & Plan: Patient has not had a DEXA scan.   -Provide patient with contact information to schedule DEXA scan. -Prolia injection administered today by CMA  Orders: -     Denosumab -     Denosumab -     VITAMIN D 25 Hydroxy (Vit-D Deficiency, Fractures)  Vitamin B 12 deficiency -     Vitamin B12  Primary hypertension Assessment & Plan: Well controlled Refill Lisinopril 10 mg daily   Orders: -     Comprehensive metabolic panel -     CBC -     Lisinopril; Take 1 tablet (10 mg total) by mouth daily.  Dispense: 90 tablet; Refill: 3  Multiple thyroid nodules Assessment & Plan: Last thyroid function test was in March of the previous year, with results trending towards normal. Patient declined thyroid ablation at that time.   -Order thyroid function tests today to assess current status. -Follows wit Endocrinology  Orders: -     TSH  Aortic atherosclerosis (HCC) -      Lipid panel     PDMP reviewed  Return if symptoms worsen or fail to improve, for PCP.  Dana Allan,  MD

## 2023-06-11 NOTE — Assessment & Plan Note (Signed)
Last thyroid function test was in March of the previous year, with results trending towards normal. Patient declined thyroid ablation at that time.   -Order thyroid function tests today to assess current status. -Follows wit Endocrinology

## 2023-06-11 NOTE — Patient Instructions (Addendum)
It was a pleasure meeting you today. Thank you for allowing me to take part in your health care.  Our goals for today as we discussed include:  We will get some labs today.  If they are abnormal or we need to do something about them, I will call you.  If they are normal, I will send you a message on MyChart (if it is active) or a letter in the mail.  If you don't hear from [place] in 2 weeks, please call the office at the number below.   Referral sent for Dexa scan. Please call to schedule appointment. [facility] [address] [place], [place] [address] [phone]      This is a list of the screening recommended for you and due dates:  Health Maintenance  Topic Date Due   Flu Shot  02/08/2023   COVID-19 Vaccine (4 - 2023-24 season) 03/11/2023   Pneumonia Vaccine (2 of 2 - PCV) 03/22/2023   Hepatitis C Screening  06/20/2023*   Cologuard (Stool DNA test)  08/02/2025   DTaP/Tdap/Td vaccine (2 - Td or Tdap) 08/18/2031   DEXA scan (bone density measurement)  Completed   Zoster (Shingles) Vaccine  Completed   HPV Vaccine  Aged Out   Mammogram  Discontinued  *Topic was postponed. The date shown is not the original due date.     Follow up as needed   If you have any questions or concerns, please do not hesitate to call the office at [phone].  I look forward to our next visit and until then take care and stay safe.  Regards,   Dana Allan, MD   [facility]

## 2023-06-19 ENCOUNTER — Encounter: Payer: Self-pay | Admitting: Family Medicine

## 2023-08-22 ENCOUNTER — Encounter: Payer: Self-pay | Admitting: Family Medicine

## 2023-08-22 ENCOUNTER — Other Ambulatory Visit: Payer: Self-pay

## 2023-08-22 DIAGNOSIS — Z1231 Encounter for screening mammogram for malignant neoplasm of breast: Secondary | ICD-10-CM

## 2023-08-22 DIAGNOSIS — Z780 Asymptomatic menopausal state: Secondary | ICD-10-CM

## 2023-08-22 NOTE — Addendum Note (Signed)
Addended by: Vertis Kelch on: 08/22/2023 02:43 PM   Modules accepted: Orders

## 2023-09-07 ENCOUNTER — Ambulatory Visit
Admission: RE | Admit: 2023-09-07 | Discharge: 2023-09-07 | Disposition: A | Discharge status: Home / Self Care | Payer: BC Managed Care – PPO | Source: Ambulatory Visit | Attending: Family Medicine | Admitting: Family Medicine

## 2023-09-07 DIAGNOSIS — Z780 Asymptomatic menopausal state: Secondary | ICD-10-CM | POA: Insufficient documentation

## 2023-09-07 DIAGNOSIS — M810 Age-related osteoporosis without current pathological fracture: Secondary | ICD-10-CM | POA: Diagnosis not present

## 2023-09-26 DIAGNOSIS — D320 Benign neoplasm of cerebral meninges: Secondary | ICD-10-CM | POA: Diagnosis not present

## 2023-09-26 DIAGNOSIS — C50219 Malignant neoplasm of upper-inner quadrant of unspecified female breast: Secondary | ICD-10-CM | POA: Diagnosis not present

## 2023-11-09 ENCOUNTER — Telehealth: Payer: Self-pay

## 2023-11-09 NOTE — Telephone Encounter (Signed)
 Tanya Olson

## 2023-11-12 ENCOUNTER — Other Ambulatory Visit (HOSPITAL_COMMUNITY): Payer: Self-pay

## 2023-11-12 NOTE — Telephone Encounter (Signed)
 PA submitted via fax.

## 2023-11-13 ENCOUNTER — Other Ambulatory Visit (HOSPITAL_COMMUNITY): Payer: Self-pay

## 2023-11-13 NOTE — Telephone Encounter (Signed)
 Pt ready for scheduling for PROLIA  on or after : 12/10/23  Option# 1: Buy/Bill (Office supplied medication)  Out-of-pocket cost due at time of clinic visit: $55  Number of injection/visits approved: 2  Primary: [employer] Prolia  co-insurance: $55 Admin fee co-insurance: 0%  Secondary: --- Prolia  co-insurance:  Admin fee co-insurance:   Medical Benefit Details: Date Benefits were checked: 11/06/23 Deductible: NO/ Coinsurance: $55/ Admin Fee: 0%  Prior Auth: APPROVED PA# [id number] Expiration Date: 11/12/23-11/11/24  # of doses approved: 2 ----------------------------------------------------------------------- Option# 2- Med Obtained from pharmacy:  Pharmacy benefit: Copay $---MUST USE [facility] (Paid to pharmacy) Admin Fee: 0% (Pay at clinic)  Prior Auth: N/A PA# Expiration Date:   # of doses approved:   If patient wants fill through the pharmacy benefit please send prescription to: [email or web address], and include estimated need by date in rx notes. Pharmacy will ship medication directly to the office.  Patient IS eligible for Prolia  Copay Card. Copay Card can make patient's cost as little as $25. Link to apply: [email or web address]://[email or web address]  ** This summary of benefits is an estimation of the patient's out-of-pocket cost. Exact cost may very based on individual plan coverage.

## 2023-12-17 NOTE — Telephone Encounter (Signed)
 Called patient & she asked to call me back. I made pt aware that I was calling to schedule her Prolia  and of the amount due. Okay to schedule when she returns call. Thanks

## 2023-12-28 NOTE — Telephone Encounter (Signed)
 Made second attempt to reach pt to schedule Prolia .  Left voicemail to return call and schedule. Pt is overdue now.   Amount due: 11

## 2023-12-31 ENCOUNTER — Telehealth: Payer: Self-pay

## 2023-12-31 NOTE — Telephone Encounter (Signed)
 Copied from CRM #[id number]. Topic: Appointments - Scheduling Inquiry for Clinic >> Dec 31, 2023  9:14 AM Tanya Olson wrote: Reason for CRM: Patient is calling to schedule prolia  injection please follow up

## 2024-01-02 ENCOUNTER — Ambulatory Visit (INDEPENDENT_AMBULATORY_CARE_PROVIDER_SITE_OTHER)

## 2024-01-02 DIAGNOSIS — M810 Age-related osteoporosis without current pathological fracture: Secondary | ICD-10-CM

## 2024-01-02 MED ORDER — DENOSUMAB 60 MG/ML ~~LOC~~ SOSY
60.0000 mg | PREFILLED_SYRINGE | SUBCUTANEOUS | Status: DC
  Administered 2024-01-02: 60 mg via SUBCUTANEOUS

## 2024-01-02 NOTE — Progress Notes (Signed)
 Patient was administered a Prolia  injection into her left arm subcutaneously. Patient tolerated the Prolia  injection well.

## 2024-06-03 ENCOUNTER — Telehealth: Payer: Self-pay

## 2024-06-03 NOTE — Telephone Encounter (Signed)
 Prolia  VOB initiated via [email or web address]  Next Prolia  inj DUE: 07/03/24

## 2024-06-04 DIAGNOSIS — Z853 Personal history of malignant neoplasm of breast: Secondary | ICD-10-CM | POA: Diagnosis not present

## 2024-06-04 DIAGNOSIS — Z86011 Personal history of benign neoplasm of the brain: Secondary | ICD-10-CM | POA: Diagnosis not present

## 2024-06-04 DIAGNOSIS — Z170 Estrogen receptor positive status [ER+]: Secondary | ICD-10-CM | POA: Diagnosis not present

## 2024-06-04 DIAGNOSIS — Z9889 Other specified postprocedural states: Secondary | ICD-10-CM | POA: Diagnosis not present

## 2024-06-04 DIAGNOSIS — M810 Age-related osteoporosis without current pathological fracture: Secondary | ICD-10-CM | POA: Diagnosis not present

## 2024-06-09 ENCOUNTER — Other Ambulatory Visit (HOSPITAL_COMMUNITY): Payer: Self-pay

## 2024-06-09 ENCOUNTER — Ambulatory Visit: Payer: Self-pay

## 2024-06-09 NOTE — Telephone Encounter (Signed)
 Pt ready for scheduling for PROLIA  on or after : 07/03/24  Option# 1: Buy/Bill (Office supplied medication)  Out-of-pocket cost due at time of clinic visit: $0  Number of injection/visits approved: 2  Primary: [employer] Prolia  co-insurance: 0% Admin fee co-insurance: 0%  Secondary: --- Prolia  co-insurance:  Admin fee co-insurance:   Medical Benefit Details: Date Benefits were checked: 06/09/24 Deductible: NO/ Coinsurance: 0%/ Admin Fee: 0%  Prior Auth: APPROVED PA# [id number] Expiration Date: 11/12/23-11/11/24  # of doses approved: 2 ----------------------------------------------------------------------- Option# 2- Med Obtained from pharmacy:  Pharmacy benefit: Copay $--- (Paid to pharmacy) Admin Fee: --- (Pay at clinic)  Prior Auth: --- PA# Expiration Date:   # of doses approved:   If patient wants fill through the pharmacy benefit please send prescription to: ---, and include estimated need by date in rx notes. Pharmacy will ship medication directly to the office.  Patient IS eligible for Prolia  Copay Card. Copay Card can make patient's cost as little as $25. Link to apply: [email or web address]://[email or web address]  ** This summary of benefits is an estimation of the patient's out-of-pocket cost. Exact cost may very based on individual plan coverage.

## 2024-06-09 NOTE — Telephone Encounter (Signed)
 SABRA

## 2024-06-09 NOTE — Telephone Encounter (Signed)
 FYI Only or Action Required?: FYI only for provider: appointment scheduled on tomorrowmorning.  Patient was last seen in primary care on 06/11/2023 by Hope Merle, MD.  Called Nurse Triage reporting Hand Pain.Hurt hand after falling down a flight of stairs.  Symptoms began a week ago.  Interventions attempted: OTC medications: tylenol.  Symptoms are: unchanged.  Triage Disposition: See Physician Within 24 Hours  Patient/caregiver understands and will follow disposition?: Yes                 Copied from CRM #[id number]. Topic: Clinical - Red Word Triage >> Jun 09, 2024 12:32 PM Alfonso ORN wrote: Red Word that prompted transfer to Nurse Triage: Red Word that prompted transfer to Nurse Triage: injury on hand from fall 10 days ago still hurts , hasn't had new pt visit yet Reason for Disposition  [1] MODERATE pain (e.g., interferes with normal activities) AND [2] high-risk adult (e.g., age > 60 years, osteoporosis, chronic steroid use)  Answer Assessment - Initial Assessment Questions 1. MECHANISM: How did the injury happen?     Fell down flight of stairs 2. ONSET: When did the injury happen? (e.g., minutes, hours ago)      10 days 3. APPEARANCE of INJURY: What does the injury look like?      Swollen - mild discolored 4. SEVERITY: Can you use your hand normally? Can you bend your fingers into a ball and then fully open them?     Yes - but it hurts 5. SIZE: For cuts, bruises, or swelling, ask: How large is it? (e.g., inches or centimeters; entire hand)      hand 6. PAIN: How bad is the pain? (Scale 0-10; or none, mild, moderate, severe)     6/10 7. TETANUS: For any breaks in the skin, ask: When was your last tetanus booster?     no 8. OTHER SYMPTOMS: Do you have any other symptoms?      HA, Bruises  Protocols used: Hand Injury-A-AH

## 2024-06-10 ENCOUNTER — Ambulatory Visit: Admitting: Family

## 2024-06-10 ENCOUNTER — Ambulatory Visit: Admission: RE | Admit: 2024-06-10 | Discharge: 2024-06-10 | Disposition: A | Attending: Family | Admitting: Family

## 2024-06-10 ENCOUNTER — Encounter: Payer: Self-pay | Admitting: Family

## 2024-06-10 ENCOUNTER — Ambulatory Visit
Admission: RE | Admit: 2024-06-10 | Discharge: 2024-06-10 | Disposition: A | Source: Ambulatory Visit | Attending: Family | Admitting: Family

## 2024-06-10 VITALS — BP 124/80 | HR 84 | Temp 98.0°F | Ht 60.0 in | Wt 128.4 lb

## 2024-06-10 DIAGNOSIS — S6992XA Unspecified injury of left wrist, hand and finger(s), initial encounter: Secondary | ICD-10-CM | POA: Diagnosis not present

## 2024-06-10 DIAGNOSIS — M1812 Unilateral primary osteoarthritis of first carpometacarpal joint, left hand: Secondary | ICD-10-CM | POA: Diagnosis not present

## 2024-06-10 DIAGNOSIS — M79642 Pain in left hand: Secondary | ICD-10-CM | POA: Insufficient documentation

## 2024-06-10 DIAGNOSIS — M25542 Pain in joints of left hand: Secondary | ICD-10-CM | POA: Diagnosis not present

## 2024-06-10 NOTE — Progress Notes (Signed)
 Acute Office Visit  Subjective:     Patient ID: Tanya Olson, female    DOB: [birth year], 68 y.o.   MRN: [id number]  Chief Complaint  Patient presents with   Acute Visit    Left hand injury from falling down stairs x 2 weeks ago  Pain 2-3/10 Left hand feels stiff and sore    HPI Patient is in today with complaints of left hand pain after an injury falling down the steps 2 weeks ago.  She reports using her left hand to brace her fall.  Continues to have pain and stiffness in her left hand that she rates a 2-3 out of 10.  Pain is worse when using the hand.  Better with rest.  Admits to swelling in the left hand as well.  Reports having a fractured pinky finger on the left hand in the past.  Review of Systems  Constitutional: Negative.   Respiratory: Negative.    Cardiovascular: Negative.   Musculoskeletal:        Left hand pain and swelling  Skin: Negative.   Neurological: Negative.   Psychiatric/Behavioral: Negative.    All other systems reviewed and are negative.  Past Medical History:  Diagnosis Date   Abnormal thyroid  function test 06/07/2021   Breast cancer (HCC) 11/2009   Bilateral. S/P bilateral mastectomy with reconstruction   Cancer (HCC)    left side initially no chemo or radiation    COVID-19    01/06/21   Facial asymmetry    left eyebrow puffy intermittently ? Bells vs related thyroid  no clear dx in the past    Frozen shoulder    2019   Hypertension    Meningioma [facility])    denies double vision has MRI Q 6 months in [place]; MRI sch 09/02/21 ? radiation   Multiple thyroid  nodules    Osteoporosis    with family history    Pancreatitis    severe 2/2 gallstones    Postoperative examination 01/17/2020   Thyroid  nodule 01/08/2019    Social History   Socioeconomic History   Marital status: Married    Spouse name: Not on file   Number of children: Not on file   Years of education: Not on file   Highest education level: Professional school degree (e.g., MD, DDS,  DVM, JD)  Occupational History   Not on file  Tobacco Use   Smoking status: Never   Smokeless tobacco: Never  Substance and Sexual Activity   Alcohol use: Yes    Comment: social drinking 1-2 glasses/week   Drug use: Never   Sexual activity: Yes    Birth control/protection: Post-menopausal  Other Topics Concern   Not on file  Social History Narrative   Married 3 sons    Husband Zell Scudder [phone]=DPR works at [employer] back and forth here and [employer] in [place] and [place] in [place]   Social Drivers of Health   Financial Resource Strain: Low Risk  (06/10/2023)   Overall Financial Resource Strain (CARDIA)    Difficulty of Paying Living Expenses: Not hard at all  Food Insecurity: No Food Insecurity (06/10/2023)   Hunger Vital Sign    Worried About Running Out of Food in the Last Year: Never true    Ran Out of Food in the Last Year: Never true  Transportation Needs: No Transportation Needs (06/10/2023)   PRAPARE - Administrator, Civil Service (Medical): No    Lack of Transportation (Non-Medical): No  Physical Activity: Insufficiently Active (06/10/2023)   Exercise Vital Sign    Days of Exercise per Week: 5 days    Minutes of Exercise per Session: 20 min  Stress: No Stress Concern Present (06/10/2023)   [employer] of Occupational Health - Occupational Stress Questionnaire    Feeling of Stress : Not at all  Social Connections: Unknown (06/10/2023)   Social Connection and Isolation Panel    Frequency of Communication with Friends and Family: More than three times a week    Frequency of Social Gatherings with Friends and Family: Once a week    Attends Religious Services: Patient declined    Database Administrator or Organizations: Patient declined    Attends Engineer, Structural: Not on file    Marital Status: Married  Catering Manager Violence: Not on file    Past Surgical History:  Procedure Laterality Date   BIOPSY THYROID      thyriod bx  benign    BRAIN SURGERY     2022   BREAST SURGERY  2011   Bilateral mastectomy   CHOLECYSTECTOMY  1995   CHOLECYSTECTOMY, LAPAROSCOPIC     COSMETIC SURGERY  2011   Mastectomy reconstruction   OOPHORECTOMY     b/l    PLACEMENT OF BREAST IMPLANTS      Family History  Problem Relation Age of Onset   Cancer Sister        triple negative breat cancer BRCA negative    Cancer Mother        breast    Hypertension Mother    Diabetes Father    Cancer Maternal Grandmother        ovarian    Hypertension Other    Cancer Sister     No Known Allergies  Current Outpatient Medications on File Prior to Visit  Medication Sig Dispense Refill   CALCIUM CITRATE PO Take by mouth.     cholecalciferol (VITAMIN D3) 25 MCG (1000 UNIT) tablet Take 1,000 Units by mouth daily.     denosumab  (PROLIA ) 60 MG/ML SOSY injection Inject into the skin. Prolia  60 mg/mL subcutaneous solution; 60 milligram(s) subcutaneous every 6 months Quantity: 0 Refills: 0 Ordered: 16-Jan-2020 Cesario Cap LinGeneric Substitution Allowed Comments: denosumab , for bones     lisinopril  (ZESTRIL ) 10 MG tablet Take 1 tablet (10 mg total) by mouth daily. 90 tablet 3   Current Facility-Administered Medications on File Prior to Visit  Medication Dose Route Frequency Provider Last Rate Last Admin   denosumab  (PROLIA ) injection 60 mg  60 mg Subcutaneous Once Hope Merle, MD       denosumab  (PROLIA ) injection 60 mg  60 mg Subcutaneous Q6 months Marylynn Verneita CROME, MD   60 mg at 01/02/24 1034    BP 124/80   Pulse 84   Temp 98 F (36.7 C)   Ht 5' (1.524 m)   Wt 128 lb 6.4 oz (58.2 kg)   SpO2 99%   BMI 25.08 kg/m chart      Objective:    BP 124/80   Pulse 84   Temp 98 F (36.7 C)   Ht 5' (1.524 m)   Wt 128 lb 6.4 oz (58.2 kg)   SpO2 99%   BMI 25.08 kg/m    Physical Exam Vitals reviewed.  Constitutional:      Appearance: Normal appearance.  Cardiovascular:     Rate and Rhythm: Normal rate and regular rhythm.  Pulmonary:      Effort: Pulmonary effort is normal.  Breath sounds: Normal breath sounds.  Musculoskeletal:        General: Swelling, tenderness and signs of injury present. No deformity.     Comments: Left hand: Tenderness to palpation over the fourth metacarpal.  Mild swelling.  No bruising.  Skin:    General: Skin is warm and dry.  Neurological:     General: No focal deficit present.     Mental Status: She is alert and oriented to person, place, and time. Mental status is at baseline.  Psychiatric:        Mood and Affect: Mood normal.        Behavior: Behavior normal.        Thought Content: Thought content normal.        Judgment: Judgment normal.     No results found for any visits on 06/10/24.      Assessment & Plan:   Problem List Items Addressed This Visit   None Visit Diagnoses       Left hand pain    -  Primary   Relevant Orders   DG Hand Complete Left     Hand injuries, left, initial encounter           No orders of the defined types were placed in this encounter.  Sent to outpatient [facility] for x-ray of her left hand.  Will notify patient pending results and discuss further treatment plan thereafter.  For now, rest, ice No follow-ups on file.  Traeson Dusza B Tunisha Ruland, FNP

## 2024-06-13 ENCOUNTER — Ambulatory Visit: Payer: Self-pay | Admitting: Family

## 2024-06-13 ENCOUNTER — Other Ambulatory Visit: Payer: Self-pay | Admitting: Family

## 2024-06-13 MED ORDER — MELOXICAM 7.5 MG PO TABS: 7.5000 mg | ORAL_TABLET | Freq: Every day | ORAL | 1 refills | Status: DC

## 2024-07-09 ENCOUNTER — Telehealth: Payer: Self-pay

## 2024-07-09 DIAGNOSIS — M810 Age-related osteoporosis without current pathological fracture: Secondary | ICD-10-CM

## 2024-07-09 NOTE — Telephone Encounter (Signed)
 Received approval letter    Effective 07/10/24, Jubbonti and Stoboclo will be preferred. Approval letter attached to chart

## 2024-07-14 ENCOUNTER — Other Ambulatory Visit: Payer: Self-pay

## 2024-07-14 ENCOUNTER — Other Ambulatory Visit (HOSPITAL_COMMUNITY): Payer: Self-pay

## 2024-07-14 MED ORDER — DENOSUMAB-BBDZ 60 MG/ML ~~LOC~~ SOSY
60.0000 mg | PREFILLED_SYRINGE | SUBCUTANEOUS | 1 refills | Status: DC
  Filled 2024-07-15: qty 1, 180d supply, fill #0

## 2024-07-14 MED ORDER — DENOSUMAB-BBDZ 60 MG/ML ~~LOC~~ SOSY: 60.0000 mg | PREFILLED_SYRINGE | SUBCUTANEOUS | Status: AC

## 2024-07-14 NOTE — Addendum Note (Signed)
 Addended by: BRIEN SHARENE RAMAN on: 07/14/2024 03:55 PM   Modules accepted: Orders

## 2024-07-14 NOTE — Telephone Encounter (Signed)
 Prolia /JUBBONTI  VOB initiated via [email or web address]  Next Prolia  inj DUE: NOW

## 2024-07-15 ENCOUNTER — Other Ambulatory Visit: Payer: Self-pay

## 2024-07-16 MED ORDER — DENOSUMAB-BBDZ 60 MG/ML ~~LOC~~ SOSY: 60.0000 mg | PREFILLED_SYRINGE | SUBCUTANEOUS | 1 refills | Status: DC

## 2024-07-16 NOTE — Addendum Note (Signed)
 Addended by: BRIEN SHARENE RAMAN on: 07/16/2024 04:52 PM   Modules accepted: Orders

## 2024-07-18 ENCOUNTER — Ambulatory Visit

## 2024-07-18 VITALS — BP 120/70 | HR 77 | Temp 98.4°F | Ht 60.0 in | Wt 127.4 lb

## 2024-07-18 DIAGNOSIS — D329 Benign neoplasm of meninges, unspecified: Secondary | ICD-10-CM

## 2024-07-18 DIAGNOSIS — E042 Nontoxic multinodular goiter: Secondary | ICD-10-CM

## 2024-07-18 DIAGNOSIS — Z853 Personal history of malignant neoplasm of breast: Secondary | ICD-10-CM | POA: Diagnosis not present

## 2024-07-18 DIAGNOSIS — Z131 Encounter for screening for diabetes mellitus: Secondary | ICD-10-CM

## 2024-07-18 DIAGNOSIS — R911 Solitary pulmonary nodule: Secondary | ICD-10-CM | POA: Diagnosis not present

## 2024-07-18 DIAGNOSIS — I700 Atherosclerosis of aorta: Secondary | ICD-10-CM

## 2024-07-18 DIAGNOSIS — M810 Age-related osteoporosis without current pathological fracture: Secondary | ICD-10-CM

## 2024-07-18 DIAGNOSIS — I10 Essential (primary) hypertension: Secondary | ICD-10-CM

## 2024-07-18 LAB — COMPREHENSIVE METABOLIC PANEL WITH GFR
ALT: 15 U/L (ref 3–35)
AST: 22 U/L (ref 5–37)
Albumin: 4.4 g/dL (ref 3.5–5.2)
Alkaline Phosphatase: 69 U/L (ref 39–117)
BUN: 15 mg/dL (ref 6–23)
CO2: 30 meq/L (ref 19–32)
Calcium: 10.1 mg/dL (ref 8.4–10.5)
Chloride: 102 meq/L (ref 96–112)
Creatinine, Ser: 0.92 mg/dL (ref 0.40–1.20)
GFR: 63.86 mL/min
Glucose, Bld: 84 mg/dL (ref 70–99)
Potassium: 4.1 meq/L (ref 3.5–5.1)
Sodium: 139 meq/L (ref 135–145)
Total Bilirubin: 0.6 mg/dL (ref 0.2–1.2)
Total Protein: 7.3 g/dL (ref 6.0–8.3)

## 2024-07-18 LAB — LIPID PANEL
Cholesterol: 210 mg/dL — ABNORMAL HIGH (ref 28–200)
HDL: 55.5 mg/dL
LDL Cholesterol: 130 mg/dL — ABNORMAL HIGH (ref 10–99)
NonHDL: 154.1
Total CHOL/HDL Ratio: 4
Triglycerides: 119 mg/dL (ref 10.0–149.0)
VLDL: 23.8 mg/dL (ref 0.0–40.0)

## 2024-07-18 LAB — MAGNESIUM: Magnesium: 2.3 mg/dL (ref 1.5–2.5)

## 2024-07-18 LAB — TSH: TSH: 0.31 u[IU]/mL — ABNORMAL LOW (ref 0.35–5.50)

## 2024-07-18 LAB — HEMOGLOBIN A1C: Hgb A1c MFr Bld: 5.8 % (ref 4.6–6.5)

## 2024-07-18 LAB — VITAMIN D 25 HYDROXY (VIT D DEFICIENCY, FRACTURES): VITD: 44.78 ng/mL (ref 30.00–100.00)

## 2024-07-18 MED ORDER — DENOSUMAB-BBDZ 60 MG/ML ~~LOC~~ SOSY: 60.0000 mg | PREFILLED_SYRINGE | SUBCUTANEOUS | 1 refills | Status: AC

## 2024-07-18 MED ORDER — LISINOPRIL 10 MG PO TABS: 10.0000 mg | ORAL_TABLET | Freq: Every day | ORAL | 3 refills | Status: AC

## 2024-07-18 NOTE — Addendum Note (Signed)
 Addended by: BRIEN SHARENE RAMAN on: 07/18/2024 10:39 AM   Modules accepted: Orders

## 2024-07-18 NOTE — Telephone Encounter (Signed)
 Per discussion at OV, pt to schedule

## 2024-07-18 NOTE — Assessment & Plan Note (Signed)
 Bilateral ER positive, HER-2 negative breast cancer, diagnosed in 2011 with bilateral mastectomy, and 10 plus years of endocrine therapy with an aromatase inhibitor.  Continue follow-up with Dr. Julie Gold annually.

## 2024-07-18 NOTE — Assessment & Plan Note (Addendum)
 Noted to have aortic atherosclerosis on CT done for cardiac scoring on 09/15/2022.  She has a history of elevated LDL and is hesitant on starting statin.  Repeat fasting lipid panel today.  Management pending result.  Will benefit from starting statin given: The 10-year ASCVD risk score (Arnett DK, et al., 2019) is: 9.6%  Orders:   Lipid panel

## 2024-07-18 NOTE — Assessment & Plan Note (Addendum)
 Check A1c Orders:   HgB A1c

## 2024-07-18 NOTE — Assessment & Plan Note (Addendum)
 Previously followed up with endocrinologist Dr. Littie Caffey MD. thyroid  biopsy has been benign in the past.  Since she is asymptomatic she did not proceed with the treatment recommended at the time by her endocrinologist.  Her TSH has been normal lower side.  She is interested for second opinion for further management into thyroid  nodules.  Referral to [facility] endocrinologist made today.  Will also check her TSH. Orders:   TSH   Ambulatory referral to Endocrinology

## 2024-07-18 NOTE — Assessment & Plan Note (Addendum)
 Mild hypercalcemia noted on previous lab exam.  Check PTH, magnesium.  Given history of osteoporosis, multiple thyroid  nodules recommend endocrinology evaluation and management.  Referral to [facility] endocrinology made since patient is also looking for second opinion for thyroid  nodule management.  Orders:   PTH, intact (no Ca)   Magnesium   Ambulatory referral to Endocrinology

## 2024-07-18 NOTE — Assessment & Plan Note (Addendum)
 H/O use of aromatase inhibitor for 10 years for bilateral ER positive, HER-2 negative breast cancer.  Has been on Prolia  for more than 10 years. History of mild hypercalcemia on 06/11/2023 and 06/04/2024. Given patient's history with cancer, multiple thyroid  nodule, elevated LDL cholesterol, duration of treatment with Prolia  ( insurance now requiring treatment with Jubbonti ) recommend referral to endocrinologist for further evaluation and management of osteoporosis.  We will check vitamin D , CMP today. Held Jubbonti  injection pending lab review.  Orders:   VITAMIN D  25 Hydroxy (Vit-D Deficiency, Fractures)   Ambulatory referral to Endocrinology

## 2024-07-18 NOTE — Telephone Encounter (Signed)
 Pt will have labs done today at [facility] appt with Dr Abbey. And discuss Jubbonti  and proceed with providers recommendation

## 2024-07-18 NOTE — Assessment & Plan Note (Addendum)
 Incidental right middle lobe 6 mm noncalcified nodule noted on 09/15/2022.   Ordered non-contrast chest CT lungs for follow-up. Orders:   CT CHEST LUNG CA SCREEN LOW DOSE W/O CM; Future

## 2024-07-18 NOTE — Assessment & Plan Note (Addendum)
 Blood pressure on arrival slightly elevated.  Repeat blood pressure within goal.  Continue lisinopril  10 mg daily.  Check CMP. Orders:   lisinopril  (ZESTRIL ) 10 MG tablet; Take 1 tablet (10 mg total) by mouth daily.   Comprehensive metabolic panel

## 2024-07-18 NOTE — Progress Notes (Signed)
 "  Established Patient Office Visit TOC from Dr. Hope    Subjective  Patient ID: Tanya Olson, female    DOB: 1956-06-26  Age: 68 y.o. MRN: [id number]  Chief Complaint  Patient presents with   Establish Care    Discussed the use of AI scribe software for clinical note transcription with the patient, who gave verbal consent to proceed.  History of Present Illness Tanya Olson is a 69 year old female who presents for transfer of care from previous PCP.  H/O breast cancer, osteoporosis, multiple thyroid  nodules: - Bilateral ER positive, HER-2 negative breast cancer, diagnosed in 2011 with bilateral mastectomy, and 10 years of endocrine therapy with an aromatase inhibitor.  She continues to follow-up with oncologist Dr. Julie Gold in [place] .  Recently had labs done which showed hypercalcemia.  - Osteoporosis treatment: Was treated with Prolia  for more than 12 years (started around 2012).  Vitamin D  normal on 06/04/2024.  Mild hypercalcemia with total calcium  10.5 mg per DL on 88/73/7974. Insurance no longer covering Prolia , covering Jubbonti  has not started it yet. Has elevated LDL and coronary CT from 09/15/2022 score 0 with aortic atherosclerosis on imaging. Last DEXA from 09/07/2023 with BMD at left femur neck in osteoporotic range with T-score -2.7, previous DEXA from 01/22/2017 with left femur neck T-score -2.8.  - Previously tried Reclast, did not tolerate it due to adverse effects.  - She also takes over-the-counter calcium  and vitamin D  supplements.  - Thyroid  nodules, low TSH: Previously seen by endocrinologist Erasmo Caffey MD).  No longer following up.  Heat intolerance, constipation.  Denies difficulty swallowing.  - She has a history of elevated creatinine and mildly reduced GFR. She has used NSAIDs like ibuprofen and meloxicam , though she does not take them regularly.  - She takes lisinopril  for blood pressure management and her blood pressure typically runs around 120/70.   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 She drinks three to four cans of Diet Coke daily and needs to increase her water intake.  - Following up with neurosurgereon Dr. Sullivan [facility]) for history  of meningioma.   - Normal Cologuard;08/02/22   - CT cardiac scoring from 09/15/2022 also showed patient to have 6 mm noncalcified right middle lobe nodule and was recommended to follow-up with Noncon CT chest in 18 to 24 months.  No pulmonary symptoms.  - History of elevated LDL, hesitant to start statin.  Coronary calcium  score 0 but showed aortic atherosclerosis on imaging from 09/15/2022.    ROS As per HPI    Objective:     BP 120/70   Pulse 77   Temp 98.4 F (36.9 C) (Oral)   Ht 5' (1.524 m)   Wt 127 lb 6.4 oz (57.8 kg)   SpO2 99%   BMI 24.88 kg/m      07/18/2024   10:59 AM 06/10/2024    8:48 AM 06/11/2023   11:30 AM  Depression screen PHQ 2/9  Decreased Interest 0 0 0  Down, Depressed, Hopeless 0 0 0  PHQ - 2 Score 0 0 0  Altered sleeping 0 0 0  Tired, decreased energy 0 0 0  Change in appetite 1 0 0  Feeling bad or failure about yourself  0 0 0  Trouble concentrating 0 1 0  Moving slowly or fidgety/restless 0 0 0  Suicidal thoughts 0 0 0  PHQ-9 Score 1 1  0   Difficult doing work/chores Not difficult at all Not difficult at all Not difficult at all     Data saved with a previous flowsheet row definition      07/18/2024   10:59 AM 06/10/2024    8:49 AM 06/11/2023   11:31 AM  GAD 7 : Generalized Anxiety Score  Nervous, Anxious, on Edge 0 0 0  Control/stop worrying 0 0 0  Worry too much - different things 0 0 0  Trouble relaxing 0 1 0  Restless 0 0 0  Easily annoyed or irritable 0 0 0  Afraid - awful might happen 0 0 0  Total GAD 7 Score 0 1 0  Anxiety Difficulty Not difficult at all Not difficult at all Not difficult at all      07/18/2024   10:59 AM 06/10/2024    8:48 AM  06/11/2023   11:30 AM  Depression screen PHQ 2/9  Decreased Interest 0 0 0  Down, Depressed, Hopeless 0 0 0  PHQ - 2 Score 0 0 0  Altered sleeping 0 0 0  Tired, decreased energy 0 0 0  Change in appetite 1 0 0  Feeling bad or failure about yourself  0 0 0  Trouble concentrating 0 1 0  Moving slowly or fidgety/restless 0 0 0  Suicidal thoughts 0 0 0  PHQ-9 Score 1 1 0   Difficult doing work/chores Not difficult at all Not difficult at all Not difficult at all     Data saved with a previous flowsheet row definition      07/18/2024   10:59 AM 06/10/2024    8:49 AM 06/11/2023   11:31 AM  GAD 7 : Generalized Anxiety Score  Nervous, Anxious, on Edge 0 0 0  Control/stop worrying 0 0 0  Worry too much - different things 0 0 0  Trouble relaxing 0 1 0  Restless 0 0 0  Easily annoyed or irritable 0 0 0  Afraid - awful might happen 0 0 0  Total GAD 7 Score 0 1 0  Anxiety Difficulty Not difficult at all Not difficult at all Not difficult at all   SDOH Screenings   Food Insecurity: No Food Insecurity (07/16/2024)  Housing: Low Risk (07/16/2024)  Transportation Needs: No Transportation Needs (07/16/2024)  Alcohol Screen: Low Risk (07/16/2024)  Depression (PHQ2-9): Low Risk (07/18/2024)  Financial Resource Strain: Low Risk (07/16/2024)  Physical Activity: Insufficiently Active (07/16/2024)  Social Connections: Moderately Isolated (07/16/2024)  Stress: No Stress Concern Present (07/16/2024)  Tobacco Use: Low Risk (07/18/2024)     Physical Exam Constitutional:      General: She is not in acute distress.    Appearance: Normal appearance.  HENT:     Head: Normocephalic and atraumatic.     Right Ear: Tympanic membrane normal.     Left Ear: Tympanic membrane normal.     Mouth/Throat:     Mouth: Mucous membranes are moist.  Neck:     Thyroid : No thyroid  mass or thyroid  tenderness.  Cardiovascular:     Rate and Rhythm: Normal rate and regular rhythm.  Pulmonary:     Effort: Pulmonary effort is normal.      Breath sounds: Normal breath sounds.  Abdominal:     General: Bowel sounds are normal.     Palpations: Abdomen is soft.  Musculoskeletal:     Cervical back: Neck supple. No rigidity.     Right lower leg: No edema.     Left lower leg:  No edema.  Lymphadenopathy:     Cervical: No cervical adenopathy.  Skin:    General: Skin is warm.  Neurological:     Mental Status: She is alert and oriented to person, place, and time.  Psychiatric:        Mood and Affect: Mood normal.        Behavior: Behavior normal.        No results found for any visits on 07/18/24.  The 10-year ASCVD risk score (Arnett DK, et al., 2019) is: 9.6%     Assessment & Plan:  Patient was found to have mildly elevated creatinine at 1.07 and GFR of 57 during her most recent lab result.  Will repeat CMP. Encouraged to avoid use of Advil , Motrin, Aleve, Ibuprofen and increased hydration with water (Drinks 3-4 diet Coke daily). Assessment & Plan Age-related osteoporosis without current pathological fracture H/O use of aromatase inhibitor for 10 years for bilateral ER positive, HER-2 negative breast cancer.  Has been on Prolia  for more than 10 years. History of mild hypercalcemia on 06/11/2023 and 06/04/2024. Given patient's history with cancer, multiple thyroid  nodule, elevated LDL cholesterol, duration of treatment with Prolia  ( insurance now requiring treatment with Jubbonti ) recommend referral to endocrinologist for further evaluation and management of osteoporosis.  We will check vitamin D , CMP today. Held Jubbonti  injection pending lab review.  Orders:   VITAMIN D  25 Hydroxy (Vit-D Deficiency, Fractures)   Ambulatory referral to Endocrinology  Primary hypertension Blood pressure on arrival slightly elevated.  Repeat blood pressure within goal.  Continue lisinopril  10 mg daily.  Check CMP. Orders:   lisinopril  (ZESTRIL ) 10 MG tablet; Take 1 tablet (10 mg total) by mouth daily.   Comprehensive metabolic  panel  Multiple thyroid  nodules Previously followed up with endocrinologist Dr. Littie Caffey MD. thyroid  biopsy has been benign in the past.  Since she is asymptomatic she did not proceed with the treatment recommended at the time by her endocrinologist.  Her TSH has been normal lower side.  She is interested for second opinion for further management into thyroid  nodules.  Referral to [facility] endocrinologist made today.  Will also check her TSH. Orders:   TSH   Ambulatory referral to Endocrinology  Aortic atherosclerosis Noted to have aortic atherosclerosis on CT done for cardiac scoring on 09/15/2022.  She has a history of elevated LDL and is hesitant on starting statin.  Repeat fasting lipid panel today.  Management pending result.  Will benefit from starting statin given: The 10-year ASCVD risk score (Arnett DK, et al., 2019) is: 9.6%  Orders:   Lipid panel  Nodule of right lung Incidental right middle lobe 6 mm noncalcified nodule noted on 09/15/2022.   Ordered non-contrast chest CT lungs for follow-up. Orders:   CT CHEST LUNG CA SCREEN LOW DOSE W/O CM; Future  Screening for diabetes mellitus Check A1c. Orders:   HgB A1c  Hypercalcemia Mild hypercalcemia noted on previous lab exam.  Check PTH, magnesium.  Given history of osteoporosis, multiple thyroid  nodules recommend endocrinology evaluation and management.  Referral to [facility] endocrinology made since patient is also looking for second opinion for thyroid  nodule management.  Orders:   PTH, intact (no Ca)   Magnesium   Ambulatory referral to Endocrinology  History of breast cancer in female Bilateral ER positive, HER-2 negative breast cancer, diagnosed in 2011 with bilateral mastectomy, and 10 plus years of endocrine therapy with an aromatase inhibitor.  Continue follow-up with Dr. Julie Gold annually.  Meningioma (HCC) Continue follow up with neurosurgereon Dr. Sullivan [facility]).    I personally spent a total of 50  minutes in the care of the patient today including preparing to see the patient, getting/reviewing separately obtained history, performing a medically appropriate exam/evaluation, counseling and educating, placing orders, referring and communicating with other health care professionals, documenting clinical information in the EHR, independently interpreting results, and communicating results.   The 10-year ASCVD risk score (Arnett DK, et al., 2019) is: 9.6%   Return in about 6 months (around 01/15/2025).   Luke Shade, MD "

## 2024-07-18 NOTE — Assessment & Plan Note (Signed)
 Continue follow up with neurosurgereon Dr. Sullivan [facility]ice [facility]).

## 2024-07-20 ENCOUNTER — Ambulatory Visit: Payer: Self-pay

## 2024-07-20 DIAGNOSIS — E782 Mixed hyperlipidemia: Secondary | ICD-10-CM

## 2024-07-20 DIAGNOSIS — I700 Atherosclerosis of aorta: Secondary | ICD-10-CM

## 2024-07-20 LAB — PARATHYROID HORMONE, INTACT (NO CA): PTH: 18 pg/mL (ref 16–77)

## 2024-07-20 NOTE — Progress Notes (Signed)
"  The 10-year ASCVD risk score (Arnett DK, et al., 2019) is: 9.2%  "

## 2024-07-21 ENCOUNTER — Other Ambulatory Visit (HOSPITAL_COMMUNITY): Payer: Self-pay

## 2024-07-21 MED ORDER — ROSUVASTATIN CALCIUM 5 MG PO TABS: 5.0000 mg | ORAL_TABLET | Freq: Every day | ORAL | 3 refills | Status: AC

## 2024-07-21 NOTE — Telephone Encounter (Signed)
 Tanya Olson

## 2024-07-21 NOTE — Telephone Encounter (Signed)
 Pt ready for scheduling for JUBBONTI  on or after : 07/21/24  Option# 1: Buy/Bill (Office supplied medication)  Out-of-pocket cost due at time of clinic visit: $110 + DEDUCTIBLE  Number of injection/visits approved: 2  Primary: [employer] Prolia  co-insurance: $55 Admin fee co-insurance: $55  Secondary: --- Prolia  co-insurance:  Admin fee co-insurance:   Medical Benefit Details: Date Benefits were checked: 07/21/24 Deductible: $0 Met of $700 Required/ Coinsurance: $55/ Admin Fee: $55  Prior Auth: APPROVED PA# [id number] Expiration Date: 07/10/24-11/11/24  # of doses approved: 2 ----------------------------------------------------------------------- Option# 2- Med Obtained from pharmacy:  Pharmacy benefit: Copay $--- (Paid to pharmacy) Admin Fee: --- (Pay at clinic)  Prior Auth: --- PA# Expiration Date:   # of doses approved:   If patient wants fill through the pharmacy benefit please send prescription to: ---, and include estimated need by date in rx notes. Pharmacy will ship medication directly to the office.  Patient IS eligible for Prolia  Copay Card. Copay Card can make patient's cost as little as $25. Link to apply: [email or web address]://www.[email or web address].[email or web address]  ** This summary of benefits is an estimation of the patient's out-of-pocket cost. Exact cost may very based on individual plan coverage.

## 2024-07-21 NOTE — Telephone Encounter (Signed)
 1. Aortic atherosclerosis (Primary) - rosuvastatin  (CRESTOR ) 5 MG tablet; Take 1 tablet (5 mg total) by mouth daily.  Dispense: 90 tablet; Refill: 3 - Lipid panel; Future - Comp Met (CMET); Future  2. Mixed hyperlipidemia - rosuvastatin  (CRESTOR ) 5 MG tablet; Take 1 tablet (5 mg total) by mouth daily.  Dispense: 90 tablet; Refill: 3 - Lipid panel; Future - Comp Met (CMET); Future   Luke Shade, MD

## 2024-07-24 ENCOUNTER — Telehealth: Payer: Self-pay

## 2024-07-24 NOTE — Telephone Encounter (Signed)
 The office received this below for the patient in regards to the Jubbonti  prescription. Please advise, as there is nothing in the previous telephone encounter in regards to the Pharmacy part of the telephone encounter.

## 2024-07-25 ENCOUNTER — Telehealth: Payer: Self-pay

## 2024-07-25 DIAGNOSIS — R911 Solitary pulmonary nodule: Secondary | ICD-10-CM

## 2024-07-25 NOTE — Telephone Encounter (Signed)
 1. Nodule of right lung (Primary) - Incidental right middle lobe 6 mm noncalcified nodule noted on 09/15/2022.  CT Chest Wo Contrast; Future (for follow up).   Luke Shade, MD

## 2024-07-29 ENCOUNTER — Other Ambulatory Visit (HOSPITAL_COMMUNITY): Payer: Self-pay

## 2024-07-29 ENCOUNTER — Telehealth: Payer: Self-pay

## 2024-07-29 NOTE — Telephone Encounter (Signed)
 Copied from CRM [id number]. Topic: Appointments - Scheduling Inquiry for Clinic >> Jul 29, 2024  8:38 AM Rea ORN wrote: Reason for CRM: Pt called to advise that she received a message to schedule her Jubbonti  injection. The [facility] doesn't list this injection as one that I can schedule. Pt also asking if the injection/medication is at the office?  Please call back to schedule,  [phone]

## 2024-07-29 NOTE — Telephone Encounter (Signed)
 PA request has been Cancelled. New Encounter has been or will be created for follow up. For additional info see Pharmacy Prior Auth telephone encounter from 07/29/2024.

## 2024-07-29 NOTE — Telephone Encounter (Signed)
 Pharmacy Patient Advocate Encounter   Received notification from Physician's Office that prior authorization for Jubbinti 60 mg/ml sosy injection is required/requested.   Insurance verification completed.   The patient is insured through [employer].   Per test claim: Per test claim, medication is not covered due to plan/benefit exclusion, PA not submitted at this time

## 2024-07-29 NOTE — Telephone Encounter (Signed)
 Sent pt my chart message

## 2024-07-29 NOTE — Telephone Encounter (Signed)
 Tanya Olson

## 2024-07-29 NOTE — Telephone Encounter (Signed)
 We have received another electronic fax stating that this patient, that [facility] stating patient needs a prior authorization completed for this medication. Could someone assist with if the medication is approved or if it still needs authorization, so patient can be scheduled for the injection. Please advise?

## 2024-07-29 NOTE — Telephone Encounter (Signed)
 Noted.

## 2024-08-04 NOTE — Telephone Encounter (Signed)
 Lillia Lengel clinical:  Do we have a follow up on Jubbonti  from prior auth department? I have sent a direct message to [place].  [place]:  Can we please follow up on CT chest ordered on 07/25/24 and endocrine referral to [facility] clinic that was made on 07/18/24 please.   Please let me know on updates.   Thank you,  Luke Shade, MD

## 2024-08-04 NOTE — Telephone Encounter (Signed)
 Need clarification on pt's Jubbonti .  This encounter states that medication is not covered by patients insurance but encounter dated 07/09/24 notes Buy/Bill option.  So is the medication not covered under the pharmacy benefit but is covered under the medical benefit.

## 2024-08-05 NOTE — Telephone Encounter (Signed)
 Please update patient on the most recent message from our referral coordinator.   Thank you,  Luke Shade, MD

## 2024-08-06 ENCOUNTER — Other Ambulatory Visit (HOSPITAL_COMMUNITY): Payer: Self-pay

## 2024-08-06 NOTE — Telephone Encounter (Signed)
 Per pharmacy benefit PA, Prolia  and Stoboclo are formulary.  PA submitted for Prolia  via latent. Key: [id number]  Per medical benefit PA, Jubbonti  and Stoboclo are preferred. After review, approval letter was for [facility]. Submitted urgent PA for Jubbonti  via [employer].

## 2024-08-07 ENCOUNTER — Other Ambulatory Visit (HOSPITAL_COMMUNITY): Payer: Self-pay

## 2024-08-07 NOTE — Telephone Encounter (Signed)
 MEDICAL PA APPROVED

## 2024-08-07 NOTE — Telephone Encounter (Signed)
 PHARMACY PA APPROVED    08/06/24-08/06/25 MUST USE [facility], [phone], OR FAX [phone].

## 2024-08-07 NOTE — Telephone Encounter (Signed)
 Spoke with [facility] representative to check the status of patient's Jubbtoni's prescription. Pharmacy technician ran a test claim for Jubbtoni and it was not covered on the patient's formulary. Pharmacy technician states she tried Prolia  and it says the patient was approved from 08/06/24 until 08/06/26, but the co-pay was $125. Pharmacy technician states the co-pay may be coming from the patient's deductible not being met. She also informed me that the patient has never received Prolia  from their pharmacy, but we could send a prescription or call and speak with the pharmacist directly to get the Prolia  for the patient. Informed her would speak with my supervisor and we'd give them a call back with the next recommended steps.

## 2024-08-07 NOTE — Telephone Encounter (Signed)
 PA request has been Approved. For additional info see Pharmacy Prior Auth telephone encounter from 07/09/24 and 07/29/24.

## 2024-08-07 NOTE — Telephone Encounter (Signed)
 Pt ready for scheduling for JUBBONTI  on or after : 08/08/23  Option# 1: Buy/Bill (Office supplied medication)  Out-of-pocket cost due at time of clinic visit: $110 + DEDUCTIBLE  Number of injection/visits approved: 2  Primary: [employer] Prolia  co-insurance: $55 Admin fee co-insurance: $55  Secondary: --- Prolia  co-insurance:  Admin fee co-insurance:   Medical Benefit Details: Date Benefits were checked: 07/21/24 Deductible: $0 Met of $700 Required/ Coinsurance: $55/ Admin Fee: $55  Prior Auth: APPROVED PA# [id number] Expiration Date: 08/06/24-08/06/25  # of doses approved: 2 ----------------------------------------------------------------------- Option# 2- Med Obtained from pharmacy: PROLIA  PREFERRED FOR PHARMACY BENEFIT  Pharmacy benefit: Copay $MUST FILL AT [facility] (Paid to pharmacy) Admin Fee: $55 (Pay at clinic)  Prior Auth: APPROVED PA# [id number] Expiration Date: 08/06/24-08/06/25  # of doses approved: 2   If patient wants fill through the pharmacy benefit please send prescription to: [facility], and include estimated need by date in rx notes. Pharmacy will ship medication directly to the office.  Patient IS eligible for Prolia  Copay Card. Copay Card can make patient's cost as little as $25. Link to apply: [email or web address]://[email or web address]  ** This summary of benefits is an estimation of the patient's out-of-pocket cost. Exact cost may very based on individual plan coverage.

## 2024-08-08 ENCOUNTER — Ambulatory Visit: Admission: RE | Admit: 2024-08-08 | Discharge: 2024-08-08 | Disposition: A | Source: Ambulatory Visit

## 2024-08-08 DIAGNOSIS — R911 Solitary pulmonary nodule: Secondary | ICD-10-CM | POA: Insufficient documentation

## 2024-08-12 ENCOUNTER — Ambulatory Visit: Payer: Self-pay

## 2024-08-12 DIAGNOSIS — R911 Solitary pulmonary nodule: Secondary | ICD-10-CM

## 2024-08-13 NOTE — Progress Notes (Signed)
 1. Pulmonary nodule, right (Primary) - To follow-up on pulmonary nodule. - CT Chest Wo Contrast; Future   Luke Shade, MD

## 2024-08-18 ENCOUNTER — Ambulatory Visit

## 2025-02-10 ENCOUNTER — Ambulatory Visit
